# Patient Record
Sex: Female | Born: 2000 | Race: White | Hispanic: No | Marital: Single | Smoking: Never smoker
Health system: Southern US, Community
[De-identification: ages and names within clinical notes are randomized; demographics above are authoritative.]

## PROBLEM LIST (undated history)

## (undated) DIAGNOSIS — F209 Schizophrenia, unspecified: Secondary | ICD-10-CM

## (undated) DIAGNOSIS — J45909 Unspecified asthma, uncomplicated: Secondary | ICD-10-CM

## (undated) HISTORY — DX: Unspecified asthma, uncomplicated: J45.909

## (undated) HISTORY — PX: TONSILLECTOMY: SUR1361

---
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
## 2021-01-15 ENCOUNTER — Ambulatory Visit (HOSPITAL_COMMUNITY)
Admission: RE | Admit: 2021-01-15 | Discharge: 2021-01-15 | Disposition: A | Discharge status: Home / Self Care | Payer: Medicaid Other | Attending: Psychiatry | Admitting: Psychiatry
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
## 2021-02-04 IMAGING — CT CT ABD-PELV W/ CM
2 of 4 series · 16 of 46 positions shown, 18 images · IV contrast (Omnipaque)
Comparison: None.

CLINICAL DATA: 19-year-old female with right lower quadrant
abdominal pain.

EXAM:
CT ABDOMEN AND PELVIS WITH CONTRAST
TECHNIQUE: Multidetector CT imaging of the abdomen and pelvis was performed
using the standard protocol following bolus administration of
intravenous contrast.
CONTRAST:  100mL OMNIPAQUE IOHEXOL 300 MG/ML  SOLN

[Series 2: axial st · axial · 0.78mm/px · z∈[-577,-152]mm · 13 of 93 slices shown, 15 images]
[im 4/93  soft-tissue]
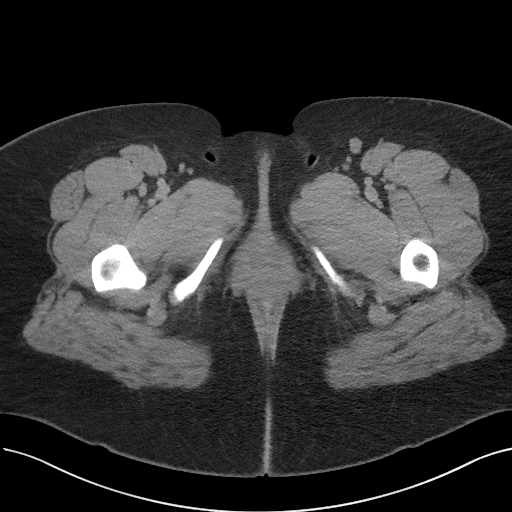
[im 4/93  bone]
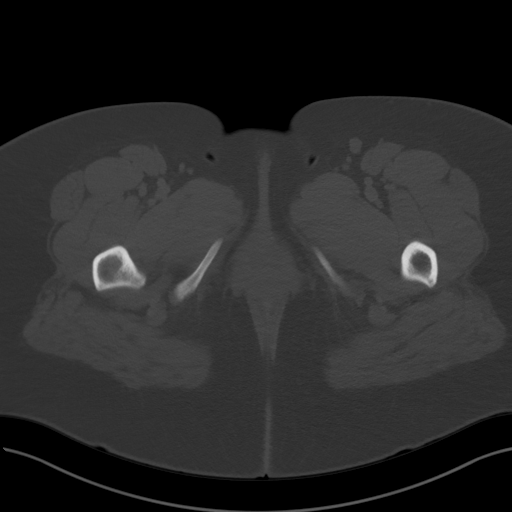
[im 12/93  soft-tissue]
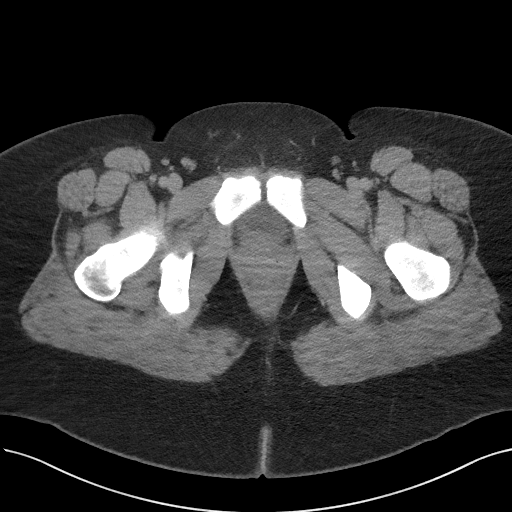
[im 20/93  soft-tissue]
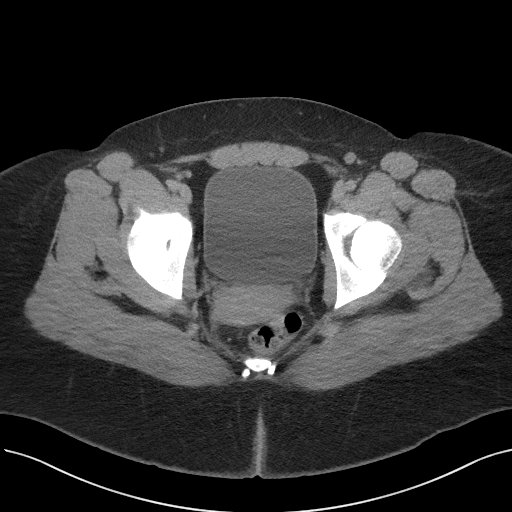
[im 27/93  soft-tissue]
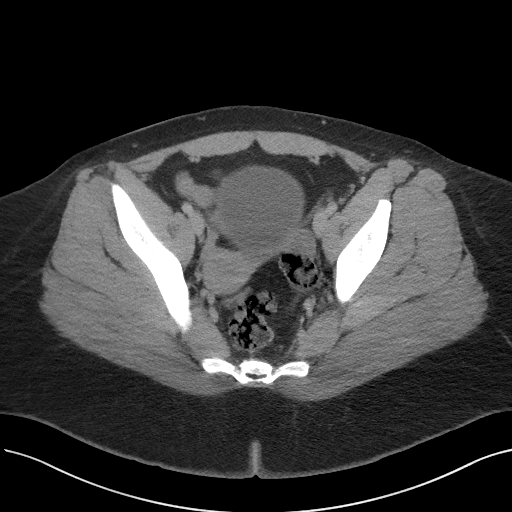
[im 31/93  soft-tissue]
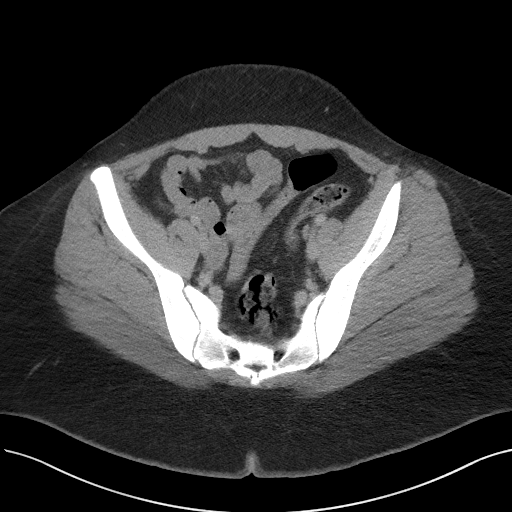
[im 39/93  soft-tissue]
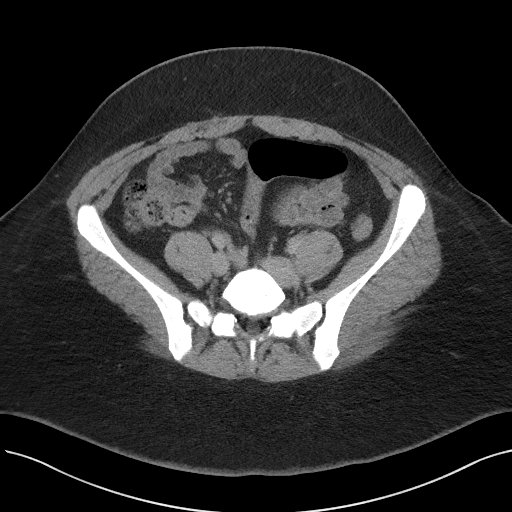
[im 47/93  soft-tissue]
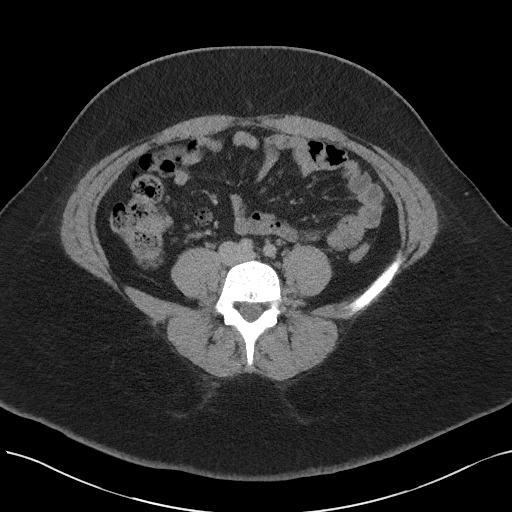
[im 54/93  soft-tissue]
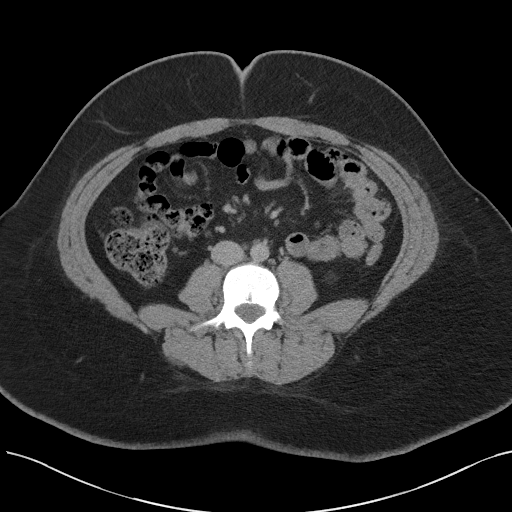
[im 62/93  soft-tissue]
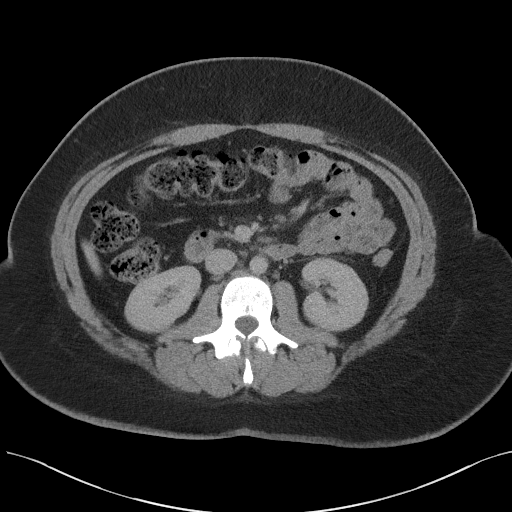
[im 62/93  bone]
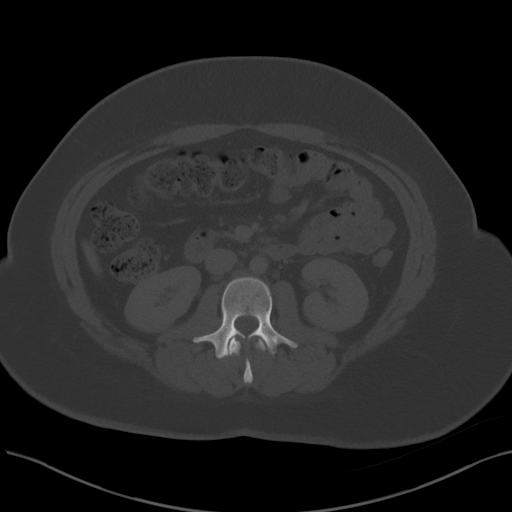
[im 66/93  soft-tissue]
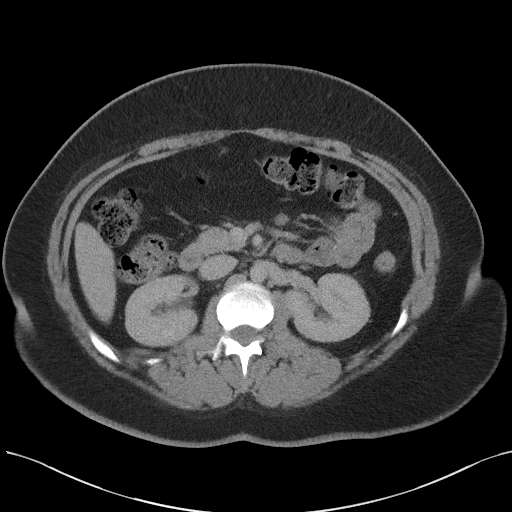
[im 73/93  soft-tissue]
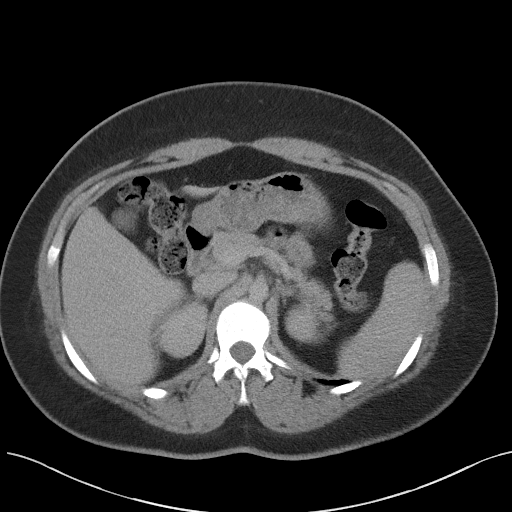
[im 81/93  soft-tissue]
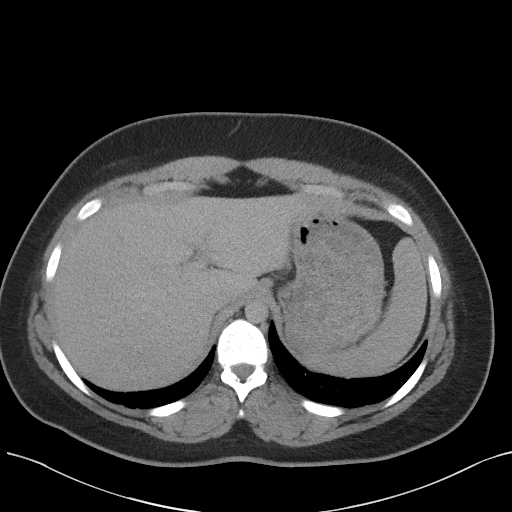
[im 89/93  soft-tissue]
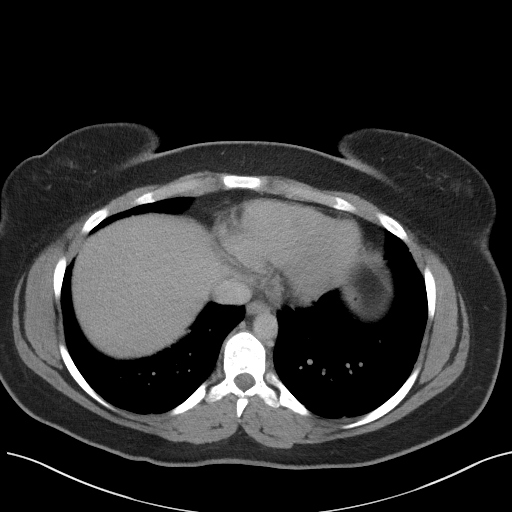

[Series 5: coronal st · coronal · 0.74mm/px · 3 of 106 slices shown]
[im 36/106  soft-tissue]
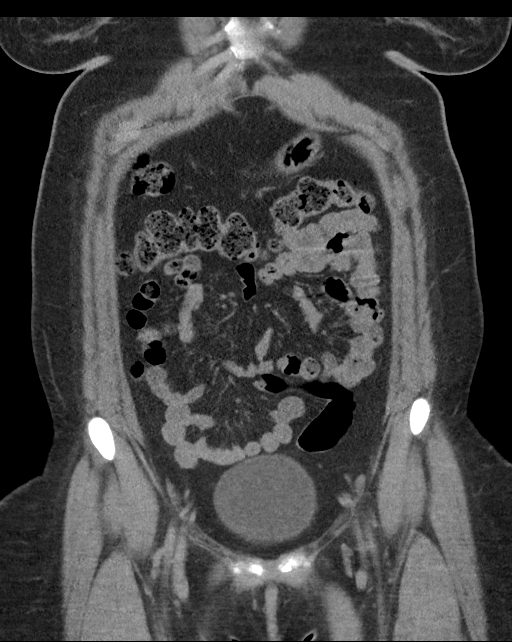
[im 47/106  soft-tissue]
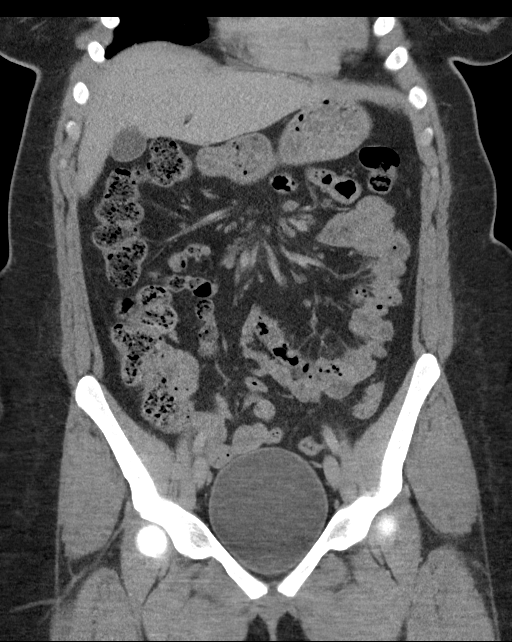
[im 59/106  soft-tissue]
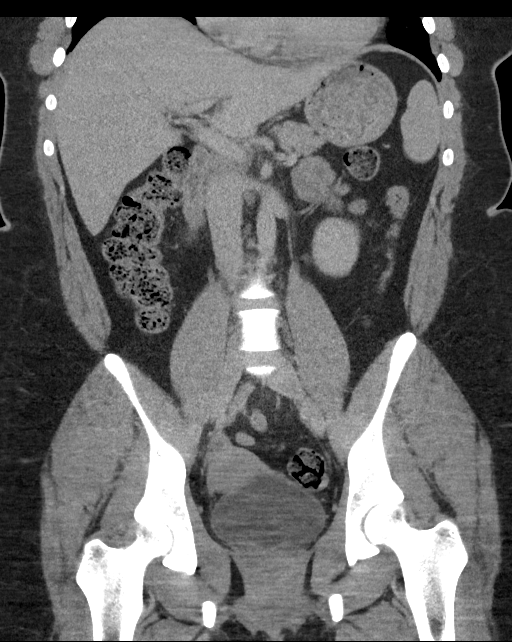

[16 of 46 positions shown; findings below may reference images not displayed]

FINDINGS: Lower chest: The visualized lung bases are clear.

No intra-abdominal free air or free fluid.

Hepatobiliary: Probable mild fatty liver. No intrahepatic biliary
dilatation. The gallbladder is unremarkable.

Pancreas: Unremarkable. No pancreatic ductal dilatation or
surrounding inflammatory changes.

Spleen: Normal in size without focal abnormality.

Adrenals/Urinary Tract: The adrenal glands unremarkable. The
kidneys, visualized ureters, and urinary bladder appear
unremarkable.

Stomach/Bowel: There is moderate stool throughout the colon. There
is no bowel obstruction or active inflammation. The appendix is
normal.

Vascular/Lymphatic: The abdominal aorta and IVC unremarkable. No
portal venous gas. There is no adenopathy.

Reproductive: The uterus is anteverted and grossly unremarkable. No
adnexal masses.

Other: None

Musculoskeletal: No acute or significant osseous findings.
IMPRESSION: No acute intra-abdominal or pelvic pathology. No bowel obstruction.
Normal appendix.

## 2021-04-27 ENCOUNTER — Ambulatory Visit (HOSPITAL_COMMUNITY)
Admission: EM | Admit: 2021-04-27 | Discharge: 2021-04-27 | Disposition: A | Discharge status: Home / Self Care | Payer: Medicaid Other | Attending: Physician Assistant | Admitting: Physician Assistant

## 2021-04-27 DIAGNOSIS — F332 Major depressive disorder, recurrent severe without psychotic features: Secondary | ICD-10-CM | POA: Insufficient documentation

## 2021-04-27 DIAGNOSIS — Z9152 Personal history of nonsuicidal self-harm: Secondary | ICD-10-CM | POA: Insufficient documentation

## 2021-04-27 DIAGNOSIS — F419 Anxiety disorder, unspecified: Secondary | ICD-10-CM | POA: Insufficient documentation

## 2021-04-27 DIAGNOSIS — F840 Autistic disorder: Secondary | ICD-10-CM | POA: Insufficient documentation
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
## 2021-04-27 NOTE — ED Notes (Signed)
Consent to call mother for transportation obtained verbally from pt. Mother contacted at [phone] who stated she was on the way to pick up Siniya. Discharge papers reviewed with Breann who verbalized understanding and all questions entertained.

## 2021-04-27 NOTE — ED Notes (Signed)
Mother here to pick up [facility]

## 2021-04-27 NOTE — Discharge Instructions (Addendum)
Please contact listed providers to schedule appointment for outpatient therapy and medication management.
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
## 2021-05-02 ENCOUNTER — Telehealth (HOSPITAL_COMMUNITY): Payer: Self-pay | Admitting: Family

## 2021-05-02 NOTE — BH Assessment (Signed)
Care Management - Follow Up [facility] Discharges   Writer attempted to make contact with patient today and was unsuccessful.  Phone just rang.  Voicemail is not set up  Per chart review, pt provided with outpatient resources

## 2022-08-19 ENCOUNTER — Ambulatory Visit (HOSPITAL_COMMUNITY)
Admission: EM | Admit: 2022-08-19 | Discharge: 2022-08-19 | Disposition: A | Discharge status: Home / Self Care | Payer: Medicaid Other | Attending: Nurse Practitioner | Admitting: Nurse Practitioner

## 2022-08-19 DIAGNOSIS — F840 Autistic disorder: Secondary | ICD-10-CM

## 2022-08-19 DIAGNOSIS — F411 Generalized anxiety disorder: Secondary | ICD-10-CM

## 2022-08-19 DIAGNOSIS — Z1152 Encounter for screening for COVID-19: Secondary | ICD-10-CM | POA: Insufficient documentation

## 2022-08-19 DIAGNOSIS — F250 Schizoaffective disorder, bipolar type: Secondary | ICD-10-CM | POA: Insufficient documentation

## 2022-08-19 DIAGNOSIS — F331 Major depressive disorder, recurrent, moderate: Secondary | ICD-10-CM | POA: Diagnosis not present

## 2022-08-19 LAB — CBC WITH DIFFERENTIAL/PLATELET
Abs Immature Granulocytes: 0.06 10*3/uL (ref 0.00–0.07)
Basophils Absolute: 0.1 10*3/uL (ref 0.0–0.1)
Basophils Relative: 0 %
Eosinophils Absolute: 0.1 10*3/uL (ref 0.0–0.5)
Eosinophils Relative: 1 %
HCT: 42.7 % (ref 36.0–46.0)
Hemoglobin: 14.7 g/dL (ref 12.0–15.0)
Immature Granulocytes: 1 %
Lymphocytes Relative: 15 %
Lymphs Abs: 1.9 10*3/uL (ref 0.7–4.0)
MCH: 30.6 pg (ref 26.0–34.0)
MCHC: 34.4 g/dL (ref 30.0–36.0)
MCV: 89 fL (ref 80.0–100.0)
Monocytes Absolute: 0.7 10*3/uL (ref 0.1–1.0)
Monocytes Relative: 6 %
Neutro Abs: 9.8 10*3/uL — ABNORMAL HIGH (ref 1.7–7.7)
Neutrophils Relative %: 77 %
Platelets: 347 10*3/uL (ref 150–400)
RBC: 4.8 MIL/uL (ref 3.87–5.11)
RDW: 11.7 % (ref 11.5–15.5)
WBC: 12.6 10*3/uL — ABNORMAL HIGH (ref 4.0–10.5)
nRBC: 0 % (ref 0.0–0.2)

## 2022-08-19 LAB — POC SARS CORONAVIRUS 2 AG: SARSCOV2ONAVIRUS 2 AG: NEGATIVE

## 2022-08-19 LAB — COMPREHENSIVE METABOLIC PANEL
ALT: 9 U/L (ref 0–44)
AST: 21 U/L (ref 15–41)
Albumin: 4.1 g/dL (ref 3.5–5.0)
Alkaline Phosphatase: 73 U/L (ref 38–126)
Anion gap: 12 (ref 5–15)
BUN: 12 mg/dL (ref 6–20)
CO2: 20 mmol/L — ABNORMAL LOW (ref 22–32)
Calcium: 9.5 mg/dL (ref 8.9–10.3)
Chloride: 107 mmol/L (ref 98–111)
Creatinine, Ser: 0.98 mg/dL (ref 0.44–1.00)
GFR, Estimated: >60 mL/min (ref >60)
Glucose, Bld: 99 mg/dL (ref 70–99)
Potassium: 4.1 mmol/L (ref 3.5–5.1)
Sodium: 139 mmol/L (ref 135–145)
Total Bilirubin: <0.1 mg/dL — ABNORMAL LOW (ref 0.3–1.2)
Total Protein: 7.4 g/dL (ref 6.5–8.1)

## 2022-08-19 LAB — RESP PANEL BY RT-PCR (RSV, FLU A&B, COVID)  RVPGX2
Influenza A by PCR: NEGATIVE
Influenza B by PCR: NEGATIVE
Resp Syncytial Virus by PCR: NEGATIVE
SARS Coronavirus 2 by RT PCR: NEGATIVE

## 2022-08-19 LAB — POCT URINE DRUG SCREEN - MANUAL ENTRY (I-SCREEN)
POC Amphetamine UR: NOT DETECTED
POC Buprenorphine (BUP): NOT DETECTED
POC Cocaine UR: NOT DETECTED
POC Marijuana UR: NOT DETECTED
POC Methadone UR: NOT DETECTED
POC Methamphetamine UR: NOT DETECTED
POC Morphine: NOT DETECTED
POC Oxazepam (BZO): NOT DETECTED
POC Oxycodone UR: NOT DETECTED
POC Secobarbital (BAR): NOT DETECTED

## 2022-08-19 LAB — ETHANOL: Alcohol, Ethyl (B): <10 mg/dL (ref <10)

## 2022-08-19 LAB — GC/CHLAMYDIA PROBE AMP (~~LOC~~) NOT AT ARMC
Chlamydia: NEGATIVE
Comment: NEGATIVE
Comment: NORMAL
Neisseria Gonorrhea: NEGATIVE

## 2022-08-19 LAB — POC URINE PREG, ED: Preg Test, Ur: NEGATIVE

## 2022-08-19 LAB — POCT PREGNANCY, URINE: Preg Test, Ur: NEGATIVE

## 2022-08-19 MED ORDER — TRAZODONE HCL 50 MG PO TABS: 50.0000 mg | ORAL_TABLET | Freq: Every evening | ORAL | 0 refills | Status: DC | PRN

## 2022-08-19 MED ORDER — MAGNESIUM HYDROXIDE 400 MG/5ML PO SUSP: 30.0000 mL | Freq: Every day | ORAL | Status: DC | PRN

## 2022-08-19 MED ORDER — HYDROXYZINE HCL 25 MG PO TABS: 25.0000 mg | ORAL_TABLET | Freq: Three times a day (TID) | ORAL | Status: DC | PRN

## 2022-08-19 MED ORDER — LITHIUM CARBONATE ER 300 MG PO TBCR: 300.0000 mg | EXTENDED_RELEASE_TABLET | Freq: Two times a day (BID) | ORAL | 0 refills | Status: DC

## 2022-08-19 MED ORDER — HYDROXYZINE HCL 25 MG PO TABS: 25.0000 mg | ORAL_TABLET | Freq: Three times a day (TID) | ORAL | 0 refills | Status: DC | PRN

## 2022-08-19 MED ORDER — ACETAMINOPHEN 325 MG PO TABS: 650.0000 mg | ORAL_TABLET | Freq: Four times a day (QID) | ORAL | Status: DC | PRN

## 2022-08-19 MED ORDER — ALUM & MAG HYDROXIDE-SIMETH 200-200-20 MG/5ML PO SUSP: 30.0000 mL | ORAL | Status: DC | PRN

## 2022-08-19 MED ORDER — TRAZODONE HCL 50 MG PO TABS: 50.0000 mg | ORAL_TABLET | Freq: Every evening | ORAL | Status: DC | PRN

## 2022-08-19 MED ORDER — LITHIUM CARBONATE ER 300 MG PO TBCR
300.0000 mg | EXTENDED_RELEASE_TABLET | Freq: Two times a day (BID) | ORAL | Status: DC
  Administered 2022-08-19: 300 mg via ORAL
  Filled 2022-08-19: qty 1

## 2022-08-19 NOTE — BH Assessment (Addendum)
Clinician spoke to [place]. With [place] to make a CPS report. Clinician provided her name and contact information. Shella reports, if a SW does not call within an hour call them back.   Clinician awaiting response.    Vertell Novak, [place], [facility], [facility] Triage Specialist [phone]

## 2022-08-19 NOTE — ED Provider Notes (Cosign Needed Addendum)
FBC/OBS ASAP Discharge Summary  Date and Time: 08/19/2022 7:01 PM  Name: Amanda [name]  MRN:  [id number]   Discharge Diagnoses:  Final diagnoses:  MDD (major depressive disorder), recurrent episode, moderate (HCC)  GAD (generalized anxiety disorder)  Schizoaffective disorder, bipolar type ([place])  Autism spectrum disorder    Subjective: Amanda [name] 22 y.o., female patient  with a history of autism, intellectual disability disorder, schizoaffective disorder bipolar type was admitted to continuations assessment after presenting to [facility] via [facility] under IVC petitioned by her mother Amanda [name] [phone]).  Per IVC: "Respondent has been diagnosed with autism, schizophrenia and bipolar disorder.  Respondent was last admitted in 2021.  Respondent suffers from self harming behaviors and has been cutting herself as recently as 6 months ago.  Respondent sent message to her friend stating I am going to kill myself to make my mother happy.  Respondent is not taking care of herself and has not showered in over a week has not slept and is over eating.  Respondent has become aggressive slamming doors concerning her family.  Respondent states that she has a Hospital doctor and there is when the blood on her hands.  Without intervention responding to harm herself or others."  Patient seen face to face by this provider, consulted with Dr. Hampton Abbot; and chart reviewed on 08/19/22.  On evaluation Amanda [name] reports patient reporting that she came in because her mom told her that she needed to see someone to get back on her medications.  Reports this happened after an argument with her mother related to letting the cat get out of the house in an argument.  At this time patient denies suicidal/self-harm/homicidal ideations, psychosis, and paranoia.  Patient reports she has been off of her psychotropic medications for a while.  Reports that she has no outpatient psychiatric  services at this time related to a past trauma and fearing going to the doctor's office.  Patient is aware and voiced understanding that in order to be restarted on her medications that she would have to make visits to the psychiatrist.  Also discussed virtual appointments with patient.  Patient admits to a prior history of self harming behaviors but states it has been over a year since she has made any cuts.  Patient reports that she has not text any sexual material to friends 43 younger.  Reports her boyfriend is 21 years old and that she does not talk to her to do things like that with any of her younger friends.  Patient reports she is eating without any difficulty.  Reports she tolerated the lithium with no adverse reactions.  Patient states that she does not sleep at night because she sleeps all day. During evaluation Amanda [name] is sitting on side of bed with no noted distress.  She is alert/oriented x 4, calm, cooperative, attentive, and responses were relevant and appropriate to assessment questions.  She spoke in a clear tone at moderate volume, and normal pace, with good eye contact.   She denies suicidal/self-harm/homicidal ideation, psychosis, and paranoia.  Objectively:  there is no evidence of psychosis/mania or delusional thinking.  She conversed coherently, with goal directed thoughts, and no distractibility, or pre-occupation and she has denied suicidal/self-harm/homicidal ideation, psychosis, and paranoia.  Collateral Information: Spoke with patient's mother Amanda [name] at [phone].  Mother reports primary concern is getting patient back on her medication and getting her set up with outpatient psychiatric services.  Reports that patient has refused  to go to the doctor related to past trauma feeling that she is going to be taken away and not allowed to come back home.  Discussed virtual appointments and mother feels that they would be a good idea.  Reports patient was also on  prazosin in the past to help with sleep and bad dreams.  Informed mother that patient was restarted on lithium but not the prazosin.  Reports that patient also informed that she was sleeping all day and was not able to sleep at night.  Informed would include sleep hygiene for patient.  Not starting prazosin at this time but patient will need to follow-up with outpatient psychiatric services for lithium level and medication management.  Understanding voiced.  Reports she will be the one to pick up patient and to give her a call when patient is ready to be discharged home.  At this time Amanda [name] is educated and verbalizes understanding of mental health resources and other crisis services in the community. She is instructed to call 911 and present to the nearest emergency room should she experience any suicidal/homicidal ideation, auditory/visual/hallucinations, or detrimental worsening of her mental health condition.    Stay Summary: Amanda [name] was admitted for Schizoaffective disorder, bipolar type [facility]), crisis management, safety, and stabilization.  Medical problems were identified and treated as needed.  Home medications were restarted, adjusted, or new medications added as needed or appropriate.  Medications treated with during admission are as follows.   Labs ordered for review:  Routine labs: CBC/Diff, CMP, HgB A1c, Lipid Profile, Magnesium, Prolactin, TSH Lab Orders         Resp panel by RT-PCR (RSV, Flu A&B, Covid) Anterior Nasal Swab         CBC with Differential/Platelet         Comprehensive metabolic panel         Ethanol         POC urine preg, ED         POCT Urine Drug Screen - (I-Screen)         POC SARS Coronavirus 2 Ag         Pregnancy, urine POC     Improvement was monitored by staff observation and clinical report along with Amanda [name] 's verbal report of emotional status and symptom reduction.  Upon completion of this admission  Amanda [name] was both mentally and medically stable for discharge denying suicidal/homicidal ideation, auditory/visual/tactile hallucinations, delusional thoughts, and paranoia.    Amanda [name] was evaluated by the treatment team for stability and plans for continued recovery upon discharge. Amanda [name] 's motivation was an integral factor for scheduling further treatment. Employment, transportation, bed availability, health status, family support, and any pending legal issues were also considered during stay. She was offered further treatment options upon discharge including but not limited to Residential, Intensive Outpatient, and Outpatient treatment.   Laberta Meredith Pyles will follow up with    Discharge Instructions      Base on the information you have provided and the presenting issue, outpatient services and resources for have been recommended.  It is imperative that you follow through with treatment recommendations within 5-7 days from the of discharge to mitigate further risk to your safety and mental well-being. A list of referrals has been provided below to get you started.  You are not limited to the list provided.  In case of an urgent crisis, you may contact the Mobile Crisis Unit with  [facility] at [phone].   Writer arranged for the patient to have an appointment with [facility] with the NP Fredric Dine on [phone] at 10am virtually for medication management.  The patient will need to come to the [facility] office today at 1:30pm to complete the paperwork.   [facility]  [address], [place], [place]      Outpatient Services for Outpatient Therapy  Based on what you have shared, a list of resources for outpatient therapy and psychiatry is provided below to get you started back on treatment.  It is imperative that you follow through with treatment within 5-7 days  from the day of discharge to prevent any further risk to your safety or mental well-being.  You are not limited to the list provided.  In case of an urgent crisis, you may contact the Mobile Crisis Unit with [facility] at [phone].         [facility] [place], [place], [address] [phone] phone  New Patient Assessment/Therapy Walk-ins Monday and Wednesday: 8am until slots are full. Every 1st and 2nd Friday: 1pm - 5pm  NO ASSESSMENT/THERAPY WALK-INS ON [place]  New Patient Psychiatry/Medication Management Walk-ins Monday-Friday: 8am-11am  For all walk-ins, we ask that you arrive by 7:30am because patient will be seen in the order of arrival.  Availability is limited; therefore, you may not be seen on the same day that you walk-in.  Our goal is to serve and meet the needs of our community to the best of our ability.   [facility] [address], [place], [place], [address] [phone] phone  [facility], [place], [place], [address] [phone] phone [phone] phone ([address], AmeriHealth, Anthem/Elevance, BCBS, [place], [place], ComPsych, Healthy Mylo, [place], [place], [place], [place], Southern Company, [place], Out of Network)  [employer], [address] [place], [place], [address] [phone] phone ([place], Anthem/Elevance, [employer] Options/Carelon, [place], [place], [place], [place], [place], Commercial Metals Company, [place], [place], [place], [facility])  [facility] [address] [place], [place], [address] [phone] phone (Medicaid, ask about other insurance)  [facility] [address], [place], [place], [address] [phone] phone [phone] fax ([address], [place] , [place], [place], [place], UHC,  Pilgrim's Pride, Self-Pay)  Evangeline Dakin [place]. [place], [place], [address] [phone] phone ([address], Anthem/Elevance, [place], [place], [place], [place], [place], Commercial Metals Company, [place], [place], [place], [facility])  [place] - 6-8 MONTH WAIT FOR THERAPY; SOONER FOR MEDICATION MANAGEMENT [address] [place], [place], [address] [phone] phone ([address], [place], [place], [place], [place], Friday Health Plans, [place], [place], [place], [place], [place], [place], Tricare, UHC, The TJ[employer] Companies, [place])  [facility] [address] [place], [place], [address] [phone] phone  [facility] [address], [place], [place], [address] [phone] phone  [facility] [address] [place], [place], [address] [phone] phone  [facility] [address] [place], [address] [phone] phone  [facility], [place] [address], [place], [address] [phone] phone  [facility] [place] [place], [place], [address] [phone] phone [phone] fax  [facility] [address] [place], [place], [address] [phone] phone [phone] fax  [employer] [phone] [address] [place], [place], [address] [phone] phone  Jinny Blossom [address] [place], [place], [address]  [phone] phone  [facility]  (Adults Only) [address] [place], [place], [address]  [phone] phone [phone] fax     ..      [email or web address]  Local Management Entity?Managed Care Organizations (LME/MCOs) [place] Currently has 6 LME/MCOs operating under the [facility] 1914 b/c  [facility] [address] [place], [place], [address] [phone] phone [phone] fax Crisis Line: [phone] [place] served: [place], [place], [place], [place], [place], [place] Office [address] [place], [place], [address] [phone] phone [phone] fax Crisis Line: [phone] served: [place], [place], [place], [place], [place], [place], [place], [place], [place], [place] Management Office [place]. [place], [place], [address] [phone] phone [phone] fax Crisis Line: [phone] [place] served: [place]ie [place], [place], [place], [place], [place], Alesia Richards, [facility] [address] [place], [place], [address] [phone] phone [phone] fax Crisis Line: [place] served: [place]ighbors, [place], [place], [place], [place], [place], Rosalio Macadamia, [place] Offices [phone] [address] [place], [address] [phone] phone Crisis Line: [place] served: [place], [place], [place], [place], [place], [place], [place], [place], [place], [place], Tri-[place], [place], [place], [place], [place], [place], [place], [place], , [place], [place], [place], [place], [place], [place], [place], [place], [facility] [address], [place], [place], [address] [phone] phone [phone] fax Crisis Line: [place] served: [place], [place], [place], [place], [place], [place], [place], [place]ow [place], [place], [place], [place], [place], [place], [place], [place], Bl[place], [place], [place], [place], [place], [place], [place], [place], [place], [place], Sh[place], [place], [place], [place], Cecille Rubin  [email or web address]                            Autism Services/Providers  The  following are clinicians within [facility] who are supposed to be specialized in working with individuals who have autism, according the [facility] Provider Directory- https://[email or web address].[email or web address]/cliniciansbehavioral.  Jacklynn Lewis Gagne [address] [place], [place], [address] [phone] phone  [place]. [place], [place], [address] [phone] phone  Caryl Comes [phone] [address] [place], [place], [address] [phone] phone  Scheryl Darter [address] [place], [place], [address] [phone] phone  Vickie Adora Fridge [address] [place], [place], [address] [phone] phone  [place], [place], [address] [phone] phone  [facility], [place] [address], [place], [address] [phone] phone  It is extremely important for caregivers to link with support groups to lessen the feeling of being isolated with this and for validation. Below is a support group for caregivers and family who have loved ones with ASD. The Autism Society of [place] can also provide additional resources that would be helpful. This organization does have a camp for children and adolescents with ASD to meet, socialize, and engage in activities together. Also check out where they may be able to also help with placement as well as assessments and therapy.  [facility] and [place]  [place], [place], [address] [phone] phone Includes: Early Intervention, General Treatment for ASD, Classroom Readiness, Social Skills Groups, and Group Family Training  Autism Society of [facility] for Life Enrichment (ACLE) [address] [place], [place], [address] [phone] phone  [facility] [place], [place], [address] [phone]  phone  [facility] [place] [address], [place], [place], [address] [phone] phone  Autism Society of [place] a Chapter/Support Group Chapters and Support Groups provide a place for families who face similar challenges to feel understood as they offer each other encouragement. guilfordchapter'@autismsociety'$ -DentistProfiles.fi  www.RetroCab.uy.gu[place]ford For more information about events and meetups, please see the calendar, contact the Chapter by email, or join the Facebook group. https://www.autismsociety-.org           Total Time spent with patient: 45 minutes  Past Psychiatric History: autism, intellectual disability disorder, schizoaffective disorder bipolar type  Past Medical History:  Past Medical History:  Diagnosis Date   Asthma    Schizophrenia ([place])     Family History: None reported Family Psychiatric History: None reported Social History:  Social History   Socioeconomic History   Marital status: Single    Spouse name: Not on file   Number of children: Not on file   Years of education: Not on file   Highest education level: Not on file  Occupational History   Not on file  Tobacco Use   Smoking status: Never   Smokeless tobacco: Never  Vaping Use   Vaping Use: Never used  Substance and Sexual Activity   Alcohol use: Never   Drug use: Never   Sexual activity: Never  Other Topics Concern   Not on file  Social History Narrative   Not on file   Social Determinants of Health   Financial Resource Strain: Not on file  Food Insecurity: Not on file  Transportation Needs: Not on file  Physical Activity: Not on file  Stress: Not on file  Social Connections: Not on file  Intimate Partner Violence: Not on file    Tobacco Cessation:  N/A, patient does not currently use tobacco products  Current Medications:  No current facility-administered medications for this encounter.    Current Outpatient Medications  Medication Sig Dispense Refill   hydrOXYzine (ATARAX) 25 MG tablet Take 1 tablet (25 mg total) by mouth 3 (three) times daily as needed for anxiety. 30 tablet 0   lithium carbonate (LITHOBID) 300 MG ER tablet Take 1 tablet (300 mg total) by mouth every 12 (twelve) hours. 60 tablet 0   prazosin (MINIPRESS) 5 MG capsule Take 5 mg by mouth at bedtime. (Patient not taking: Reported on 08/19/2022)     traZODone (DESYREL) 50 MG tablet Take 1 tablet (50 mg total) by mouth at bedtime as needed for sleep. 30 tablet 0    PTA Medications:  PTA Medications  Medication Sig   prazosin (MINIPRESS) 5 MG capsule Take 5 mg by mouth at bedtime. (Patient not taking: Reported on 08/19/2022)   lithium carbonate (LITHOBID) 300 MG ER tablet Take 1 tablet (300 mg total) by mouth every 12 (twelve) hours.   traZODone (DESYREL) 50 MG tablet Take 1 tablet (50 mg total) by mouth at bedtime as needed for sleep.       07/21/2020    2:04 PM  Depression screen PHQ 2/9  Decreased Interest 3  Down, Depressed, Hopeless 3  PHQ - 2 Score 6  Altered sleeping 0  Tired, decreased energy 3  Change in appetite 0  Feeling bad or failure about yourself  3  Trouble  concentrating 0  Moving slowly or fidgety/restless 3  PHQ-9 Score 15    Flowsheet Row ED from 08/19/2022 in [facility] OP Visit from 01/15/2021 in [place] No Risk Low Risk       Musculoskeletal  Strength & Muscle Tone: within normal limits Gait & Station: normal Patient leans: N/A  Psychiatric Specialty Exam  Presentation  General Appearance:  Appropriate for Environment  Eye Contact: Good  Speech: Clear and Coherent; Normal Rate  Speech Volume: Normal  Handedness: Right   Mood and Affect  Mood: Euthymic  Affect: Appropriate; Congruent   Thought Process  Thought Processes: Coherent; Goal Directed  Descriptions of  Associations:Intact  Orientation:Full (Time, Place and Person)  Thought Content:Logical  Diagnosis of Schizophrenia or Schizoaffective disorder in past: Yes    Hallucinations:Hallucinations: None  Ideas of Reference:None  Suicidal Thoughts:Suicidal Thoughts: No  Homicidal Thoughts:Homicidal Thoughts: No   Sensorium  Memory: Immediate Good; Recent Good  Judgment: Intact  Insight: Present   Executive Functions  Concentration: Good  Attention Span: Good  Recall: Good  Fund of Knowledge: Good  Language: Good   Psychomotor Activity  Psychomotor Activity: Psychomotor Activity: Normal   Assets  Assets: Communication Skills; Desire for Improvement; Financial Resources/Insurance; Housing; Leisure Time; Physical Health; Resilience; Social Support   Sleep  Sleep: Sleep: Fair Number of Hours of Sleep: -1   Nutritional Assessment (For OBS and FBC admissions only) Has the patient had a weight loss or gain of 10 pounds or more in the last 3 months?: No Has the patient had a decrease in food intake/or appetite?: No Does the patient have dental problems?: No Does the patient have eating habits or behaviors that may be indicators of an eating disorder including binging or inducing vomiting?: No Has the patient recently lost weight without trying?: 0 Has the patient been eating poorly because of a decreased appetite?: 0 Malnutrition Screening Tool Score: 0    Physical Exam  Physical Exam Vitals and nursing note reviewed. Exam conducted with a chaperone present.  Constitutional:      General: She is not in acute distress.    Appearance: Normal appearance. She is not ill-appearing.  HENT:     Head: Normocephalic.  Eyes:     Pupils: Pupils are equal, round, and reactive to light.  Cardiovascular:     Rate and Rhythm: Normal rate.  Pulmonary:     Effort: Pulmonary effort is normal.  Musculoskeletal:        General: Normal range of motion.     Cervical  back: Normal range of motion.  Skin:    General: Skin is warm and dry.  Neurological:     Mental Status: She is alert and oriented to person, place, and time.    Review of Systems  Constitutional: Negative.   HENT: Negative.    Eyes: Negative.   Respiratory: Negative.    Cardiovascular: Negative.   Gastrointestinal: Negative.   Genitourinary: Negative.   Musculoskeletal: Negative.   Skin: Negative.   Neurological: Negative.   Endo/Heme/Allergies: Negative.   Psychiatric/Behavioral:  Negative for hallucinations. Depression: Stable. Substance abuse: Stable. Suicidal ideas: Denies.Nervous/anxious: Stable. Insomnia: States she sleeps all day and up all night.    Blood pressure 130/74, pulse (!) 123, temperature 98.5 F (36.9 C), temperature source Oral, resp. rate 18, SpO2 98 %. There is no height or weight on file to calculate BMI.  Demographic Factors:  Caucasian  Loss Factors: NA  Historical Factors: Prior suicide attempts and Impulsivity  Risk Reduction Factors:   Sense of responsibility to family, Religious beliefs about death, Living with another person, especially a relative, and Positive social support  Continued Clinical Symptoms:  Previous Psychiatric Diagnoses and Treatments  Cognitive Features That Contribute To Risk:  None    Suicide Risk:  Minimal: No identifiable suicidal ideation.  Patients presenting with no risk factors but with morbid ruminations; may be classified as minimal risk based on the severity of the depressive symptoms  Plan Of Care/Follow-up recommendations:  Other:  Follow up with resources given  Disposition: No evidence of imminent risk to self or others at present.   Patient does not meet criteria for psychiatric inpatient admission. Supportive therapy provided about ongoing stressors. Discussed crisis plan, support from social network, calling 911, coming to the Emergency Department, and calling Suicide Hotline.  Rescind IVC After  thorough evaluation and review of information currently presented on assessment of Annagrace Natsuki Everett (respondent), there is insufficient findings to indicate respondent meets criteria for involuntary commitment or require an inpatient level of care.  Respondent is alert/oriented x 4, calm, cooperative, and mood congruent with affect.  Respondent is speaking in a clear tone at moderate volume, and normal pace, with good eye contact.  Respondents' thought process is coherent, relevant, and there is no indication that the respondent is currently responding to internal/external stimuli or experiencing delusional thought content.  Respondent denies suicidal/self-harm/homicidal ideation, psychosis, and paranoia.  Respondent has remained calm and cooperative throughout assessment and responded to questions appropriately.  Currently respondent is not significantly impaired, psychotic, or manic on exam.  A detailed risk assessment has been completed based on clinical exam and individual risk factors.  There is no evidence of imminent risk to self or others at present and respondent does not meet criteria for psychiatric inpatient admission.  Khristopher Kapaun, NP 08/19/2022, 7:01 PM
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
## 2022-08-19 NOTE — BH Assessment (Signed)
Clinician received a call back from [facility] from [place] to complete CPS report. Kayce CPS will share case with APS. Clinician expressed if APS is unable to contact her the information she shared with her is the same.    Vertell Novak, [place], [facility], [facility] Triage Specialist [phone]

## 2022-08-19 NOTE — ED Notes (Signed)
Pt given snack, watching movie. In no acute distress.
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
## 2022-08-19 NOTE — ED Notes (Signed)
Pt presents with anxious mood, affect congruent. Theone states '' I'm not really sure why I'm here. I think my mom sent me to just stay overnight and get some help. '' Pt forwards little and appears very child like with interaction with Probation officer. She denies any SI/HI and states she became upset in argument with mother but can't give details regarding what. Pt then later shares she was upset about not having wifi to her online boyfriend.  Pt denies any AH or VH. Offered breakfast and patient accepted. Pt is safe. Will con't to monitor.

## 2022-08-19 NOTE — Progress Notes (Addendum)
08/19/22 0247  Patient Reported Information  How Did You Hear About [place]? Legal System  What Is the Reason for Your Visit/Call Today? Pt reports, her mother said she was suicidal but she denies SI. Pt reports, she's not sure why her mother thinks she's suicidal. Pt reports, asked to be checked out and talk to someone. Pt also denies HI, AVH, self-injurious behaviors and access to weapons. Pt reports, she can contract for safety if discharged.  How Long Has This Been Causing You Problems? > than 6 months  What Do You Feel Would Help You the Most Today? Social Support;Treatment for Depression or other mood problem;Medication(s)  Have You Recently Had Any Thoughts About Hurting Yourself? Yes  Are You Planning to Commit Suicide/Harm Yourself At This time? No  Have you Recently Had Thoughts About [place]? No  Are You Planning To Harm Someone At This Time? No  Explanation: Pt denies.  Have You Used Any Alcohol or Drugs in the Past 24 Hours? Yes  What Did You Use and How Much? Pt reports, smoking cigarettes and vaping.  Do You Currently Have a Therapist/Psychiatrist? No  Name of Therapist/Psychiatrist Pt denies.  Have You Been Recently Discharged From Any Office Practice or Programs? Yes  Explanation of Discharge From Practice/Program Per mother, in the past the pt has ben in [place] care, group homes and PRTFs.  CCA Screening Triage Referral Assessment  Type of Contact Face-to-Face  Location of Assessment [facility] Assessment [facility]  Provider location [facility] Assessment Services  Collateral Involvement [name], mother, [phone]. Pt's mother discussed her concerns in pt's behaviors.  Does Patient Have a Stage manager Guardian? No  Legal Guardian Contact Information Per mother, pt is her own guardian.  Copy of Legal Guardianship Form in Chart No - copy requested  Legal Guardian Notified of Arrival   (Per mother, pt is her own guardian.)  Legal Guardian Notified of  Pending Discharge   (Per mother, pt is her own guardian.)  If Minor and Not Living with Parent(s), Who has Custody? Per mother, pt is her own guardian.  Is CPS involved or ever been involved? In the Past  Is APS involved or ever been involved? Never  Patient Determined To Be At Risk for Harm To Self or Others Based on Review of Patient Reported Information or Presenting Complaint? Yes, for Self-Harm (Per mother however the pt denies.)  Method No Plan  Availability of Means No access or NA  Intent Vague intent or NA  Notification Required No need or identified person  Additional Information for Danger to Others Potential  (Pt denies, HI.)  Additional Comments for Danger to Others Potential Pt denies, HI.  Are There Guns or Other Weapons in [place]? No  Types of Guns/Weapons Pt denies, access to weapons.  Are These Weapons Safely Secured?  (Pt denies, access to weapons.)  Who Could Verify You Are Able To Have These Secured: Pt denies, access to weapons.  Do You Have any Outstanding Charges, Pending Court Dates, Parole/Probation? Pt denies, legal involvement.  Contacted To Inform of Risk of Harm To Self or Others: Other: Comment (Clinician spoke to pt's mother to gather additional information.)  Does Patient Present under Involuntary Commitment? Yes  [place]  Patient Currently Receiving the Following Services: Not Receiving Services  Determination of Need Urgent (48 hours)  Options For Referral [facility] Urgent Care;Inpatient Hospitalization;Outpatient Therapy;Medication Management    Determination of need: Urgent.    Merian Capron  Ronny Flurry, [place], [facility], [facility] Triage Specialist [phone]

## 2022-08-19 NOTE — Discharge Instructions (Addendum)
Base on the information you have provided and the presenting issue, outpatient services and resources for have been recommended.  It is imperative that you follow through with treatment recommendations within 5-7 days from the of discharge to mitigate further risk to your safety and mental well-being. A list of referrals has been provided below to get you started.  You are not limited to the list provided.  In case of an urgent crisis, you may contact the Mobile Crisis Unit with [facility] at [phone].   Writer arranged for the patient to have an appointment with Neuropsychiatric with the NP Fredric Dine on [phone] at 10am virtually for medication management.  The patient will need to come to the Neuropsychiatric office today at 1:30pm to complete the paperwork.   [facility]  [address], [place], [place]      Outpatient Services for Outpatient Therapy  Based on what you have shared, a list of resources for outpatient therapy and psychiatry is provided below to get you started back on treatment.  It is imperative that you follow through with treatment within 5-7 days from the day of discharge to prevent any further risk to your safety or mental well-being.  You are not limited to the list provided.  In case of an urgent crisis, you may contact the Mobile Crisis Unit with [facility] at [phone].         [facility] [place], [place], [address] [phone] phone  New Patient Assessment/Therapy Walk-ins Monday and Wednesday: 8am until slots are full. Every 1st and 2nd Friday: 1pm - 5pm  NO ASSESSMENT/THERAPY WALK-INS ON [place]  New Patient Psychiatry/Medication Management Walk-ins Monday-Friday: 8am-11am  For all walk-ins, we ask that you arrive by 7:30am because patient will be seen in the order of arrival.  Availability is limited; therefore, you may not  be seen on the same day that you walk-in.  Our goal is to serve and meet the needs of our community to the best of our ability.   [facility] [address], [place], [place], [address] [phone] phone  [facility], [place], [place], [address] [phone] phone [phone] phone ([address], AmeriHealth, Anthem/Elevance, BCBS, [place], [place], ComPsych, Healthy [place], [place], [place], [place], [place], Southern Company, [place], Out of Network)  [employer], [place] [place], [place], [address] [phone] phone ([place], Anthem/Elevance, [employer] Options/Carelon, [place], [place], [place], [place], [place], [employer], [place], [place], [place], [facility])  [place] [address] [place], [place], [address] [phone] phone (Medicaid, ask about other insurance)  [facility] [address] [place], [place], [address] [phone] phone [phone] fax ([address], [place] , [place], [place], [place], UHC, Pilgrim's Pride, Self-Pay)  Evangeline Dakin [place]. [place], [place], [address] [phone] phone ([address] Anthem/Elevance, [place], [place], [place], [place], [place], [employer] Company, [place], [place], [place], [facility])  [place] - 6-8 MONTH WAIT FOR THERAPY; SOONER FOR MEDICATION MANAGEMENT [address], [place], [place], [address] [phone] phone ([address] [place], [place], [place], [place], Friday Health Plans, [place], [place], [place], [place], [place], [place], Tricare, UHC, The [employer] Companies, [place])  [facility] [address], [place], [place], [address] [phone] phone  [facility] [address] [place], [place], [address] [phone] phone  [facility] [address], [place], [place], [address] [phone] phone  [facility] [place] [address] [place], [address] [phone] phone  [facility], [place] [address], [place], [address] [phone] phone  [facility] [place] [place], [place], [address] [phone] phone [phone] fax  [facility] [address] [place], [place], [address] [phone] phone [phone] fax  [facility] [address] [place], [place], [address] [phone] phone  Jinny Blossom [address] [place], [place], [address]  [phone] phone  [facility] [place]  (Adults Only) [address] [place], [place], [address]  [phone] phone [phone] fax     ..      [email or web address]  Local Management Entity?Managed Care Organizations (LME/MCOs) [place] Currently has 6 LME/MCOs operating under the [facility] [address] b/c [facility] [address] [place], [place], [address] [phone] phone [phone] fax Crisis Line: [phone] [place] served: [place], [place], [place], [place], [place], [place] Office [address] [place], [place], [address] [phone] phone [phone] fax Crisis Line: [phone] served: [place], [place], [place], [place], [place], [place], [place], [place], [place], [place] Management Office [place]. [place], [place], [address] [phone] phone [phone] fax Crisis Line: [phone] [place] served: [place], [place], [place], [place], [place], Alesia Richards, [facility] [address] [place], [place], [address] [phone] phone [phone] fax Crisis Line: [place] served: [place], [place], [place], [place], [place], [place], Rosalio Macadamia, [place] Offices [phone] [address] [place], [address] [phone] phone Crisis Line: [place] served: [place], [place], [place], [place], [place], [place], [place], [place], [place], [place], [place], [place], [place], [place], [place], [place], [place], [place], [place], [place], [place], [place], [place], [place], [place], [place], [place], [facility] [address] [place], [place], [address] [phone] phone [phone] fax Crisis Line: [place] served: [place], [place], [place], [place], [place], [place], [place], [place], [place], [place] 6, [place], [place], [place], [place], [place], [place], [place], [place], [place], [place], [place], [place], [place], [place], [place], [place], [place], [place], [place], Cecille Rubin  [email or web address]                            Autism Services/Providers  The following are clinicians within [facility] who are supposed to be specialized in working with individuals who have autism, according the [facility] Provider Directory- [email or web address]/[email or web address].  Jacklynn Lewis Gagne [address] [place], [place], [address] [phone] phone  Force. [place], [place], [address] [phone] phone  Caryl Comes [phone] [place], [place], [address] [phone] phone  Scheryl Darter [address] [place], [place], [address] [phone] phone  Vickie Adora Fridge [address] [place], [place], [address] [phone] phone  [place], [place],  [address] [phone] phone  [facility], [place] [address], [place], [address] [phone] phone  It is extremely important for caregivers to link with support groups to lessen the feeling of being isolated with this and for validation. Below is a support group for caregivers and family who have loved ones with ASD. The Autism Society of [place]  can also provide additional resources that would be helpful. This organization does have a camp for children and adolescents with ASD to meet, socialize, and engage in activities together. Also check out where they may be able to also help with placement as well as assessments and therapy.  [facility] for [facility] and [place]  [place], [place], [address] [phone] phone Includes: Early Intervention, General Treatment for ASD, Classroom Readiness, Social Skills Groups, and Group Family Training  Autism Society of [place] for Life Enrichment (ACLE) [address] [place], [place], [address] [phone] phone  [facility] of [place], [place], [address] [phone] phone  [facility] [place], [place], [place], [address] [phone] phone  Autism Society of [place] a Chapter/Support Group Chapters and Support Groups provide a place for families who face similar challenges to feel understood as they offer each other encouragement. [email or web address]'@[email or web address] -DentistProfiles.fi  www.[email or web address].uy.guilford For more information about events and meetups, please see the calendar, contact the Chapter by email, or join the Facebook group. [email or web address]://www.[email or web address]-[email or web address].org

## 2022-09-14 ENCOUNTER — Other Ambulatory Visit (HOSPITAL_COMMUNITY): Payer: Self-pay | Admitting: Psychiatry

## 2022-09-15 ENCOUNTER — Other Ambulatory Visit (HOSPITAL_COMMUNITY): Payer: Self-pay | Admitting: Psychiatry

## 2023-10-24 ENCOUNTER — Ambulatory Visit
Admission: EM | Admit: 2023-10-24 | Discharge: 2023-10-24 | Disposition: A | Discharge status: Home / Self Care | Payer: MEDICAID | Attending: Internal Medicine | Admitting: Internal Medicine

## 2023-10-24 DIAGNOSIS — B379 Candidiasis, unspecified: Secondary | ICD-10-CM | POA: Diagnosis present

## 2023-10-24 DIAGNOSIS — N3001 Acute cystitis with hematuria: Secondary | ICD-10-CM | POA: Insufficient documentation

## 2023-10-24 DIAGNOSIS — T3695XA Adverse effect of unspecified systemic antibiotic, initial encounter: Secondary | ICD-10-CM | POA: Diagnosis present

## 2023-10-24 LAB — POCT URINALYSIS DIP (MANUAL ENTRY)
Bilirubin, UA: NEGATIVE
Glucose, UA: NEGATIVE mg/dL
Ketones, POC UA: NEGATIVE mg/dL
Leukocytes, UA: NEGATIVE
Nitrite, UA: POSITIVE — AB
Spec Grav, UA: >=1.03 — AB (ref 1.010–1.025)
Urobilinogen, UA: 0.2 U/dL
pH, UA: 6 (ref 5.0–8.0)

## 2023-10-24 MED ORDER — FLUCONAZOLE 150 MG PO TABS: 150.0000 mg | ORAL_TABLET | ORAL | 0 refills | Status: DC

## 2023-10-24 MED ORDER — SULFAMETHOXAZOLE-TRIMETHOPRIM 800-160 MG PO TABS: 1.0000 | ORAL_TABLET | Freq: Two times a day (BID) | ORAL | 0 refills | Status: AC

## 2023-10-24 NOTE — ED Triage Notes (Signed)
 Here with Mother. "I am having painful urination with a really bad smell". "This has been on/off about a week of so". Some "abd pain/back pain at times". No fever.

## 2023-10-24 NOTE — Discharge Instructions (Addendum)
 Your urine shows you likely have a urinary tract infection.  I have sent your urine for culture to confirm this.  We will call you if we need to change your antibiotic when we find out the type of bacteria growing in your bladder.  Take antibiotic as directed with a snack/food to avoid stomach upset. To avoid GI upset please take this medication with food.   Avoid drinking beverages that irritate the urinary tract like sodas, tea, coffee, or juice. Drink plenty of water to stay well hydrated and prevent severe infection.  If you develop any new or worsening symptoms or if your symptoms do not start to improve, pleases return here or follow-up with your primary care provider. If your symptoms are severe, please go to the emergency room.   Sitting 101/71 82  Standing 103/72 89

## 2023-10-24 NOTE — ED Provider Notes (Signed)
 [facility]    CSN: [id number] Arrival date & time: 10/24/23  1208      History   Chief Complaint Chief Complaint  Patient presents with   UTI Symptoms    HPI Amanda [name] is a 23 y.o. female.   Amanda [name] is a 23 y.o. female presenting for chief complaint of UTI Symptoms that started last week. Complains of urinary frequency, malodorous urine, and dysuria.  She drinks [facility] frequently and wonders if this is contributing to her symptoms.  Admits to minimal water intake.  Reports generalized headache and intermittent dizziness that is worsened by position changes.  Denies fever, chills, nausea, vomiting, abdominal pain, diarrhea, and flank pain.  Denies recent antibiotic or steroid use.  She does not take an SGLT2 inhibitor and denies history of frequent urinary tract infections.  She has not attempted use of any over-the-counter medications to help with symptoms PTA.     Past Medical History:  Diagnosis Date   Asthma    Schizophrenia [facility])     Patient Active Problem List   Diagnosis Date Noted   Autism spectrum disorder 08/19/2022   Schizoaffective disorder, bipolar type (HCC) 05/28/2020    Past Surgical History:  Procedure Laterality Date   TONSILLECTOMY      OB History   No obstetric history on file.      Home Medications    Prior to Admission medications   Medication Sig Start Date End Date Taking? Authorizing Provider  busPIRone (BUSPAR) 5 MG tablet Take 5 mg by mouth 3 (three) times daily. 08/27/23   [provider]  fluconazole (DIFLUCAN) 150 MG tablet Take 1 tablet (150 mg total) by mouth every 3 (three) days. 10/24/23  Yes Starlene Eaton, FNP  Oxcarbazepine (TRILEPTAL) 300 MG tablet Take 300 mg by mouth at bedtime. 08/27/23  Yes [provider]  prazosin (MINIPRESS) 1 MG capsule Take 1 mg by mouth at bedtime. 08/27/23  Yes [provider]  sulfamethoxazole-trimethoprim (BACTRIM DS)  800-160 MG tablet Take 1 tablet by mouth 2 (two) times daily for 3 days. 10/24/23 10/27/23 Yes StanhopeDanny Dye, FNP  hydrOXYzine  (ATARAX ) 25 MG tablet Take 1 tablet (25 mg total) by mouth 3 (three) times daily as needed for anxiety. 08/19/22   Rankin, Shuvon B, NP  lithium  carbonate (LITHOBID ) 300 MG ER tablet Take 1 tablet (300 mg total) by mouth every 12 (twelve) hours. 08/19/22   Rankin, Shuvon B, NP  prazosin (MINIPRESS) 5 MG capsule Take 5 mg by mouth at bedtime. Patient not taking: Reported on 08/19/2022    [provider]  traZODone  (DESYREL ) 50 MG tablet Take 1 tablet (50 mg total) by mouth at bedtime as needed for sleep. 08/19/22   Rankin, Eather Golder, NP    Family History Family History  Problem Relation Age of Onset   Diabetes Mother    Cancer Paternal Grandmother     Social History Social History   Tobacco Use   Smoking status: Never   Smokeless tobacco: Never  Vaping Use   Vaping status: Some Days   Substances: Nicotine, Flavoring  Substance Use Topics   Alcohol use: Never   Drug use: Never     Allergies   Biaxin [clarithromycin]   Review of Systems Review of Systems Per HPI  Physical Exam Triage Vital Signs ED Triage Vitals  Encounter Vitals Group     BP 10/24/23 1302 95/66     Systolic BP Percentile --  Diastolic BP Percentile --      Pulse Rate 10/24/23 1302 (!) 109     Resp 10/24/23 1302 18     Temp 10/24/23 1302 98.8 F (37.1 C)     Temp Source 10/24/23 1302 Oral     SpO2 10/24/23 1302 98 %     Weight 10/24/23 1257 252 lb 3.7 oz (114.4 kg)     Height 10/24/23 1257 5\' 10"  (1.778 m)     Head Circumference --      Peak Flow --      Pain Score 10/24/23 1254 5     Pain Loc --      Pain Education --      Exclude from Growth Chart --    No data found.  Updated Vital Signs BP 95/66 (BP Location: Left Arm)   Pulse (!) 107   Temp 98.8 F (37.1 C) (Oral)   Resp 18   Ht 5\' 10"  (1.778 m)   Wt 252 lb 3.7 oz (114.4 kg)   LMP 09/01/2023  (Exact Date)   SpO2 98%   BMI 36.19 kg/m   Visual Acuity Right Eye Distance:   Left Eye Distance:   Bilateral Distance:    Right Eye Near:   Left Eye Near:    Bilateral Near:     Physical Exam Vitals and nursing note reviewed.  Constitutional:      Appearance: She is not ill-appearing or toxic-appearing.  HENT:     Head: Normocephalic and atraumatic.     Right Ear: Hearing and external ear normal.     Left Ear: Hearing and external ear normal.     Nose: Nose normal.     Mouth/Throat:     Lips: Pink.  Eyes:     General: Lids are normal. Vision grossly intact. Gaze aligned appropriately.     Extraocular Movements: Extraocular movements intact.     Conjunctiva/sclera: Conjunctivae normal.  Pulmonary:     Effort: Pulmonary effort is normal.  Abdominal:     General: Abdomen is flat. Bowel sounds are normal. There is no distension.     Palpations: Abdomen is soft.     Tenderness: There is no abdominal tenderness. There is no right CVA tenderness, left CVA tenderness, guarding or rebound.     Hernia: No hernia is present.  Musculoskeletal:     Cervical back: Neck supple.  Skin:    General: Skin is warm and dry.     Capillary Refill: Capillary refill takes less than 2 seconds.     Findings: No rash.  Neurological:     General: No focal deficit present.     Mental Status: She is alert and oriented to person, place, and time. Mental status is at baseline.     Cranial Nerves: No dysarthria or facial asymmetry.  Psychiatric:        Mood and Affect: Mood normal.        Speech: Speech normal.        Behavior: Behavior normal.        Thought Content: Thought content normal.        Judgment: Judgment normal.      UC Treatments / Results  Labs (all labs ordered are listed, but only abnormal results are displayed) Labs Reviewed  POCT URINALYSIS DIP (MANUAL ENTRY) - Abnormal; Notable for the following components:      Result Value   Clarity, UA cloudy (*)    Spec Grav, UA  >=1.030 (*)  Blood, UA trace-intact (*)    Protein Ur, POC trace (*)    Nitrite, UA Positive (*)    All other components within normal limits  URINE CULTURE    EKG   Radiology No results found.  Procedures Procedures (including critical care time)  Medications Ordered in UC Medications - No data to display  Initial Impression / Assessment and Plan / UC Course  I have reviewed the triage vital signs and the nursing notes.  Pertinent labs & imaging results that were available during my care of the patient were reviewed by me and considered in my medical decision making (see chart for details).   1.  Acute cystitis with hematuria, antibiotic induced yeast infection Evaluation suggests acute uncomplicated cystitis.  Urine culture pending.  Low suspicion for acute pyelonephritis, kidney stone or infected stone.  She appears to be hydrated on exam. Blood pressure and heart rate from sitting to standing is stable, no orthostatic hypotension. Bactrim antibiotic ordered. Appears well hydrated, therefore will defer labs/imaging.  Patient to push fluids to stay well hydrated and reduce intake of known urinary irritants.  Discussed methods of preventing future UTI.   Counseled patient on potential for adverse effects with medications prescribed/recommended today, strict ER and return-to-clinic precautions discussed, patient verbalized understanding.    Final Clinical Impressions(s) / UC Diagnoses   Final diagnoses:  Acute cystitis with hematuria  Antibiotic-induced yeast infection     Discharge Instructions      Your urine shows you likely have a urinary tract infection.  I have sent your urine for culture to confirm this.  We will call you if we need to change your antibiotic when we find out the type of bacteria growing in your bladder.  Take antibiotic as directed with a snack/food to avoid stomach upset. To avoid GI upset please take this medication with food.    Avoid drinking beverages that irritate the urinary tract like sodas, tea, coffee, or juice. Drink plenty of water to stay well hydrated and prevent severe infection.  If you develop any new or worsening symptoms or if your symptoms do not start to improve, pleases return here or follow-up with your primary care provider. If your symptoms are severe, please go to the emergency room.   Sitting 101/71 82  Standing 103/72 89   ED Prescriptions     Medication Sig Dispense Auth. Provider   sulfamethoxazole-trimethoprim (BACTRIM DS) 800-160 MG tablet Take 1 tablet by mouth 2 (two) times daily for 3 days. 6 tablet Illyanna Petillo M, FNP   fluconazole (DIFLUCAN) 150 MG tablet Take 1 tablet (150 mg total) by mouth every 3 (three) days. 2 tablet Starlene Eaton, FNP      PDMP not reviewed this encounter.   Starlene Eaton, [place] 10/24/23 1537

## 2023-10-27 LAB — URINE CULTURE: Culture: >=100000 — AB

## 2024-07-04 ENCOUNTER — Ambulatory Visit (HOSPITAL_COMMUNITY)
Admission: EM | Admit: 2024-07-04 | Discharge: 2024-07-06 | Disposition: A | Discharge status: Other Acute Inpatient Hospital | Payer: MEDICAID

## 2024-07-04 DIAGNOSIS — F840 Autistic disorder: Secondary | ICD-10-CM | POA: Insufficient documentation

## 2024-07-04 DIAGNOSIS — F250 Schizoaffective disorder, bipolar type: Secondary | ICD-10-CM | POA: Insufficient documentation

## 2024-07-04 DIAGNOSIS — Z79899 Other long term (current) drug therapy: Secondary | ICD-10-CM | POA: Insufficient documentation

## 2024-07-04 DIAGNOSIS — F79 Unspecified intellectual disabilities: Secondary | ICD-10-CM | POA: Insufficient documentation
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
## 2024-07-05 ENCOUNTER — Other Ambulatory Visit: Payer: Self-pay

## 2024-07-05 DIAGNOSIS — F840 Autistic disorder: Secondary | ICD-10-CM | POA: Diagnosis not present

## 2024-07-05 DIAGNOSIS — F79 Unspecified intellectual disabilities: Secondary | ICD-10-CM | POA: Diagnosis not present

## 2024-07-05 DIAGNOSIS — F250 Schizoaffective disorder, bipolar type: Secondary | ICD-10-CM | POA: Diagnosis not present

## 2024-07-05 LAB — POCT URINE DRUG SCREEN - MANUAL ENTRY (I-SCREEN)
POC Amphetamine UR: NOT DETECTED
POC Buprenorphine (BUP): NOT DETECTED
POC Cocaine UR: NOT DETECTED
POC Marijuana UR: NOT DETECTED
POC Methadone UR: NOT DETECTED
POC Methamphetamine UR: NOT DETECTED
POC Morphine: NOT DETECTED
POC Oxazepam (BZO): NOT DETECTED
POC Oxycodone UR: NOT DETECTED
POC Secobarbital (BAR): NOT DETECTED

## 2024-07-05 LAB — COMPREHENSIVE METABOLIC PANEL WITH GFR
ALT: 19 U/L (ref 0–44)
AST: 29 U/L (ref 15–41)
Albumin: 4.5 g/dL (ref 3.5–5.0)
Alkaline Phosphatase: 72 U/L (ref 38–126)
Anion gap: 14 (ref 5–15)
BUN: 20 mg/dL (ref 6–20)
CO2: 25 mmol/L (ref 22–32)
Calcium: 9.9 mg/dL (ref 8.9–10.3)
Chloride: 101 mmol/L (ref 98–111)
Creatinine, Ser: 0.93 mg/dL (ref 0.44–1.00)
GFR, Estimated: >60 mL/min
Glucose, Bld: 71 mg/dL (ref 70–99)
Potassium: 4.9 mmol/L (ref 3.5–5.1)
Sodium: 140 mmol/L (ref 135–145)
Total Bilirubin: 0.5 mg/dL (ref 0.0–1.2)
Total Protein: 7.5 g/dL (ref 6.5–8.1)

## 2024-07-05 LAB — CBC WITH DIFFERENTIAL/PLATELET
Abs Immature Granulocytes: 0.11 K/uL — ABNORMAL HIGH (ref 0.00–0.07)
Basophils Absolute: 0.1 K/uL (ref 0.0–0.1)
Basophils Relative: 0 %
Eosinophils Absolute: 0 K/uL (ref 0.0–0.5)
Eosinophils Relative: 0 %
HCT: 44.7 % (ref 36.0–46.0)
Hemoglobin: 15.1 g/dL — ABNORMAL HIGH (ref 12.0–15.0)
Immature Granulocytes: 1 %
Lymphocytes Relative: 9 %
Lymphs Abs: 1.6 K/uL (ref 0.7–4.0)
MCH: 30.7 pg (ref 26.0–34.0)
MCHC: 33.8 g/dL (ref 30.0–36.0)
MCV: 90.9 fL (ref 80.0–100.0)
Monocytes Absolute: 1 K/uL (ref 0.1–1.0)
Monocytes Relative: 6 %
Neutro Abs: 15.8 K/uL — ABNORMAL HIGH (ref 1.7–7.7)
Neutrophils Relative %: 84 %
Platelets: 357 K/uL (ref 150–400)
RBC: 4.92 MIL/uL (ref 3.87–5.11)
RDW: 12 % (ref 11.5–15.5)
WBC: 18.6 K/uL — ABNORMAL HIGH (ref 4.0–10.5)
nRBC: 0 % (ref 0.0–0.2)

## 2024-07-05 LAB — ETHANOL: Alcohol, Ethyl (B): <15 mg/dL

## 2024-07-05 LAB — POCT PREGNANCY, URINE: Preg Test, Ur: NEGATIVE

## 2024-07-05 MED ORDER — DIPHENHYDRAMINE HCL 50 MG/ML IJ SOLN: 50.0000 mg | Freq: Three times a day (TID) | INTRAMUSCULAR | Status: DC | PRN

## 2024-07-05 MED ORDER — LORAZEPAM 2 MG/ML IJ SOLN: 2.0000 mg | Freq: Three times a day (TID) | INTRAMUSCULAR | Status: DC | PRN

## 2024-07-05 MED ORDER — TRAZODONE HCL 50 MG PO TABS
50.0000 mg | ORAL_TABLET | Freq: Every evening | ORAL | Status: DC | PRN
  Administered 2024-07-05: 50 mg via ORAL
  Filled 2024-07-05: qty 1

## 2024-07-05 MED ORDER — ACETAMINOPHEN 325 MG PO TABS
650.0000 mg | ORAL_TABLET | Freq: Four times a day (QID) | ORAL | Status: DC | PRN
  Administered 2024-07-05 (×2): 650 mg via ORAL
  Filled 2024-07-05 (×2): qty 2

## 2024-07-05 MED ORDER — HYDROXYZINE HCL 25 MG PO TABS
25.0000 mg | ORAL_TABLET | Freq: Three times a day (TID) | ORAL | Status: DC | PRN
  Administered 2024-07-05: 25 mg via ORAL
  Filled 2024-07-05: qty 1

## 2024-07-05 MED ORDER — HALOPERIDOL LACTATE 5 MG/ML IJ SOLN: 5.0000 mg | Freq: Three times a day (TID) | INTRAMUSCULAR | Status: DC | PRN

## 2024-07-05 MED ORDER — DIPHENHYDRAMINE HCL 50 MG PO CAPS: 50.0000 mg | ORAL_CAPSULE | Freq: Three times a day (TID) | ORAL | Status: DC | PRN

## 2024-07-05 MED ORDER — HALOPERIDOL LACTATE 5 MG/ML IJ SOLN: 10.0000 mg | Freq: Three times a day (TID) | INTRAMUSCULAR | Status: DC | PRN

## 2024-07-05 MED ORDER — LAMOTRIGINE 25 MG PO TABS
25.0000 mg | ORAL_TABLET | Freq: Every day | ORAL | Status: DC
  Administered 2024-07-05 – 2024-07-06 (×2): 25 mg via ORAL
  Filled 2024-07-05 (×2): qty 1

## 2024-07-05 MED ORDER — MAGNESIUM HYDROXIDE 400 MG/5ML PO SUSP: 30.0000 mL | Freq: Every day | ORAL | Status: DC | PRN

## 2024-07-05 MED ORDER — ALUM & MAG HYDROXIDE-SIMETH 200-200-20 MG/5ML PO SUSP: 30.0000 mL | ORAL | Status: DC | PRN

## 2024-07-05 MED ORDER — HALOPERIDOL 5 MG PO TABS: 5.0000 mg | ORAL_TABLET | Freq: Three times a day (TID) | ORAL | Status: DC | PRN
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
## 2024-07-05 NOTE — Progress Notes (Signed)
 Pt was accepted to [facility] TODAY 07/05/2024 Bed assignment: 325  Pt meets inpatient criteria per: [name] NP  Attending Physician will be: Donnelly MD  Report can be called to: [phone]  Pt can arrive after: DC   Care Team Notified: Cherylynn Ernst RN, Donia Snell NP  [place] Domnique Vantine LCSW  07/05/2024 3:12 PM

## 2024-07-05 NOTE — ED Notes (Signed)
 Pt is speaking with Donia NP at this time.

## 2024-07-05 NOTE — BH Assessment (Signed)
 Clinician spoke to Amanda [name] with [facility] Department of Social Services, Adult [employer] to make a report.  Clinician provided her name and contact information.   Clinician is awaiting return call.    Jackson JONETTA Broach, MS, [facility], [facility] Triage Specialist [phone]

## 2024-07-05 NOTE — ED Notes (Signed)
 Pt offered lunch but declined. She was given Juice. Calm and cooperative at this time.
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
## 2024-07-05 NOTE — ED Notes (Signed)
 RN spoke with patient A&Ox4, pleasant. Denies intent to harm self/others when asked. Denies A/VH or any physical complaints when asked. No acute distress noted. Active listening, support and encouragement provided. Routine safety checks conducted according to facility protocol. Encouraged patient to notify staff if thoughts of harm toward self or others arise. Patient verbalize understanding and agreement. Snacks and beverage offered.

## 2024-07-05 NOTE — ED Notes (Signed)
Patient sleeping with no s/s of distress.

## 2024-07-05 NOTE — ED Notes (Signed)
 Pt currently sleeping soundly,  breathing even and unlabored.  In view of nursing Station.

## 2024-07-05 NOTE — Progress Notes (Signed)
 Pt returned to flex area from RM 135 after speaking with DSS.  No distress noted.

## 2024-07-05 NOTE — ED Notes (Signed)
 Pt sleeping in bed, no acute distress noted. Respirations even and unlabored. Continue to monitor for safety.

## 2024-07-05 NOTE — ED Notes (Signed)
 Pt eating a hot pocket, fruit cup, and apple juice during RN assessment. Pt denies SI/HI/AVH. Bilateral foot pain managed with prn tylenol . Compliant with scheduled meds; prn trazodone  and atarax  given. She was anxious, tearful, cooperative, and pleasant. Pt took a shower this shift before going to sleep. No behavioral concerns at this time. Continue to monitor for safety.
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
## 2024-07-06 ENCOUNTER — Other Ambulatory Visit: Payer: Self-pay

## 2024-07-06 ENCOUNTER — Inpatient Hospital Stay: Admission: AD | Admit: 2024-07-06 | Payer: MEDICAID | Source: Intra-hospital | Admitting: Psychiatry

## 2024-07-06 ENCOUNTER — Encounter: Payer: Self-pay | Admitting: Psychiatry

## 2024-07-06 DIAGNOSIS — F332 Major depressive disorder, recurrent severe without psychotic features: Principal | ICD-10-CM | POA: Diagnosis present

## 2024-07-06 MED ORDER — DIPHENHYDRAMINE HCL 25 MG PO CAPS: 50.0000 mg | ORAL_CAPSULE | Freq: Three times a day (TID) | ORAL | Status: AC | PRN

## 2024-07-06 MED ORDER — LAMOTRIGINE 25 MG PO TABS
25.0000 mg | ORAL_TABLET | Freq: Every day | ORAL | Status: DC
  Administered 2024-07-06 – 2024-07-09 (×4): 25 mg via ORAL
  Filled 2024-07-06 (×4): qty 1

## 2024-07-06 MED ORDER — LORAZEPAM 2 MG/ML IJ SOLN: 2.0000 mg | Freq: Three times a day (TID) | INTRAMUSCULAR | Status: AC | PRN

## 2024-07-06 MED ORDER — HALOPERIDOL LACTATE 5 MG/ML IJ SOLN: 5.0000 mg | Freq: Three times a day (TID) | INTRAMUSCULAR | Status: AC | PRN

## 2024-07-06 MED ORDER — MAGNESIUM HYDROXIDE 400 MG/5ML PO SUSP: 30.0000 mL | Freq: Every day | ORAL | Status: AC | PRN

## 2024-07-06 MED ORDER — ACETAMINOPHEN 325 MG PO TABS: 650.0000 mg | ORAL_TABLET | Freq: Four times a day (QID) | ORAL | Status: DC | PRN

## 2024-07-06 MED ORDER — ACETAMINOPHEN 325 MG PO TABS
650.0000 mg | ORAL_TABLET | Freq: Four times a day (QID) | ORAL | Status: AC | PRN
  Administered 2024-07-13 – 2024-07-14 (×2): 650 mg via ORAL
  Filled 2024-07-06 (×2): qty 2

## 2024-07-06 MED ORDER — TRAZODONE HCL 50 MG PO TABS
50.0000 mg | ORAL_TABLET | Freq: Every evening | ORAL | Status: DC | PRN
  Administered 2024-07-06: 50 mg via ORAL
  Filled 2024-07-06: qty 1

## 2024-07-06 MED ORDER — DIPHENHYDRAMINE HCL 50 MG/ML IJ SOLN: 50.0000 mg | Freq: Three times a day (TID) | INTRAMUSCULAR | Status: AC | PRN

## 2024-07-06 MED ORDER — ALUM & MAG HYDROXIDE-SIMETH 200-200-20 MG/5ML PO SUSP: 30.0000 mL | ORAL | Status: AC | PRN

## 2024-07-06 MED ORDER — MAGNESIUM HYDROXIDE 400 MG/5ML PO SUSP: 30.0000 mL | Freq: Every day | ORAL | Status: DC | PRN

## 2024-07-06 MED ORDER — HALOPERIDOL 5 MG PO TABS: 5.0000 mg | ORAL_TABLET | Freq: Three times a day (TID) | ORAL | Status: AC | PRN

## 2024-07-06 MED ORDER — HALOPERIDOL LACTATE 5 MG/ML IJ SOLN: 10.0000 mg | Freq: Three times a day (TID) | INTRAMUSCULAR | Status: AC | PRN

## 2024-07-06 MED ORDER — ALUM & MAG HYDROXIDE-SIMETH 200-200-20 MG/5ML PO SUSP: 30.0000 mL | ORAL | Status: DC | PRN

## 2024-07-06 MED ORDER — HYDROXYZINE HCL 25 MG PO TABS
25.0000 mg | ORAL_TABLET | Freq: Three times a day (TID) | ORAL | Status: AC | PRN
  Administered 2024-07-06: 25 mg via ORAL
  Filled 2024-07-06 (×3): qty 1

## 2024-07-06 NOTE — ED Notes (Signed)
 Patient presents appropriate and cooperative. Patient currently denies SI,HI,and A/V/H with no plan or intent. Patient ate breakfast and denies any current pain. Patient is aware of pending transfer to [facility] once sheriff is able to provide an ETA. No s/s of current distress.

## 2024-07-06 NOTE — ED Notes (Signed)
Patient resting with no s/s of distress

## 2024-07-06 NOTE — Group Note (Signed)
 Date:  07/06/2024 Time:  5:11 PM  Group Topic/Focus:  Building Self Esteem:   The Focus of this group is helping patients become aware of the effects of self-esteem on their lives, the things they and others do that enhance or undermine their self-esteem, seeing the relationship between their level of self-esteem and the choices they make and learning ways to enhance self-esteem.    Participation Level:  Did Not Attend   Amanda [name] June 07/06/2024, 5:11 PM

## 2024-07-06 NOTE — Group Note (Signed)
 Date:  07/06/2024 Time:  8:58 PM  Group Topic/Focus:  Orientation:   The focus of this group is to educate the patient on the purpose and policies of crisis stabilization and provide a format to answer questions about their admission.  The group details unit policies and expectations of patients while admitted. Wrap-Up Group:   The focus of this group is to help patients review their daily goal of treatment and discuss progress on daily workbooks.    Participation Level:  Active  Participation Quality:  Appropriate and Attentive  Affect:  Appropriate  Cognitive:  Alert and Appropriate  Insight: Appropriate and Good  Engagement in Group:  Engaged  Modes of Intervention:  Orientation  Additional Comments:     Amanda [name] Servant 07/06/2024, 8:58 PM
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
## 2024-07-06 NOTE — Group Note (Signed)
 Recreation Therapy Group Note   Group Topic:Stress Management  Group Date: 07/06/2024 Start Time: 1530 End Time: 1615 Facilitators: Celestia Jeoffrey BRAVO, LRT, CTRS Location: Dayroom  Group Description: UNO. LRT and pts played games of UNO. LRT prompted group discussion on the physical signs and symptoms of stress, like those you feel when playing a competitive card game. LRT and pt discussed the physical and mental signs of stress, as well as coping skills to manage them.   Goal Area(s) Addressed: Patient will identify physical symptoms of stress. Patient will identify coping skills for stress. Patient will build frustration tolerance skills.  Patient will increase communication.    Affect/Mood: N/A   Participation Level: Did not attend    Clinical Observations/Individualized Feedback: Patient did not attend due to being a new admission.   Plan: Continue to engage patient in RT group sessions 2-3x/week.   Jeoffrey BRAVO Celestia, LRT, CTRS 07/06/2024 5:28 PM

## 2024-07-06 NOTE — ED Notes (Addendum)
 Miss charted on wong patient.  Not assigned to this patient.

## 2024-07-06 NOTE — ED Notes (Signed)
 Patient resting with eyes closed. Even rise and fall of chest with unlabored breathing. No s/s of current distress.

## 2024-07-06 NOTE — ED Notes (Signed)
 Patient is transferring to [facility] at this time via sheriff. IVC, EMTALA, and other transfer paperwork sent with patient and provided to transport. Patient is calm and cooperative at time of transport. All valuables/belongings sent with patient. Patient in no current distress.

## 2024-07-07 DIAGNOSIS — F332 Major depressive disorder, recurrent severe without psychotic features: Secondary | ICD-10-CM

## 2024-07-07 MED ORDER — ESCITALOPRAM OXALATE 10 MG PO TABS
5.0000 mg | ORAL_TABLET | Freq: Every day | ORAL | Status: DC
  Administered 2024-07-07 – 2024-07-09 (×3): 5 mg via ORAL
  Filled 2024-07-07 (×3): qty 1

## 2024-07-07 MED ORDER — TRAZODONE HCL 50 MG PO TABS
25.0000 mg | ORAL_TABLET | Freq: Every evening | ORAL | Status: AC | PRN
  Administered 2024-07-09 – 2024-07-23 (×8): 25 mg via ORAL
  Filled 2024-07-07 (×8): qty 1

## 2024-07-07 NOTE — Progress Notes (Signed)
" °   07/06/24 2100  Psych Admission Type (Psych Patients Only)  Admission Status Involuntary  Psychosocial Assessment  Patient Complaints None  Eye Contact Fair  Facial Expression Animated  Affect Appropriate to circumstance  Speech Logical/coherent  Interaction Assertive  Motor Activity Other (Comment) (WDL)  Appearance/Hygiene Disheveled  Behavior Characteristics Cooperative;Appropriate to situation  Mood Pleasant;Anxious  Aggressive Behavior  Effect No apparent injury  Thought Process  Coherency WDL  Content WDL  Delusions None reported or observed  Perception WDL  Hallucination None reported or observed  Judgment Limited  Confusion None  Danger to Self  Current suicidal ideation? Denies  Agreement Not to Harm Self Yes  Description of Agreement Verbal  Danger to Others  Danger to Others None reported or observed    "

## 2024-07-07 NOTE — BHH Suicide Risk Assessment (Incomplete)
 [facility] Admission Suicide Risk Assessment   Nursing information obtained from:  Patient Demographic factors:  Adolescent or young adult, Low socioeconomic status Current Mental Status:  NA Loss Factors:  Financial problems / change in socioeconomic status Historical Factors:  NA Risk Reduction Factors:  Sense of responsibility to family  Total Time spent with patient: {Time; 15 min - 8 hours:17441} Principal Problem: MDD (major depressive disorder), recurrent episode, severe (HCC) Diagnosis:  Principal Problem:   MDD (major depressive disorder), recurrent episode, severe (HCC)  Subjective Data: ***  Continued Clinical Symptoms:  Alcohol Use Disorder Identification Test Final Score (AUDIT): 0 The Alcohol Use Disorders Identification Test, Guidelines for Use in Primary Care, Second Edition.  World Science Writer [facility]). Score between 0-7:  no or low risk or alcohol related problems. Score between 8-15:  moderate risk of alcohol related problems. Score between 16-19:  high risk of alcohol related problems. Score 20 or above:  warrants further diagnostic evaluation for alcohol dependence and treatment.   CLINICAL FACTORS:   {Clinical Factors:22706}   Musculoskeletal: Strength & Muscle Tone: {desc; muscle tone:32375} Gait & Station: {PE GAIT ED WJUO:77474} Patient leans: {Patient Leans:21022755}  Psychiatric Specialty Exam:  Presentation  General Appearance:  Appropriate for Environment; Casual  Eye Contact: Fair  Speech: Clear and Coherent; Normal Rate  Speech Volume: Decreased  Handedness: Right   Mood and Affect  Mood: Euthymic (good)  Affect: Appropriate   Thought Process  Thought Processes: Coherent; Linear  Descriptions of Associations:Intact  Orientation:Full (Time, Place and Person)  Thought Content:Logical  History of Schizophrenia/Schizoaffective disorder:No  Duration of Psychotic Symptoms:No data  recorded Hallucinations:Hallucinations: None  Ideas of Reference:None  Suicidal Thoughts:Suicidal Thoughts: No  Homicidal Thoughts:Homicidal Thoughts: No   Sensorium  Memory: Immediate Fair; Recent Fair  Judgment: Fair  Insight: Fair   Art Therapist  Concentration: Fair  Attention Span: Fair  Recall: [employer] of Knowledge: Fair  Language: Fair   Psychomotor Activity  Psychomotor Activity: Psychomotor Activity: Normal   Assets  Assets: Communication Skills; Desire for Improvement; Housing; Resilience   Sleep  Sleep: Sleep: Good Number of Hours of Sleep: 8    Physical Exam: Physical Exam ROS Blood pressure 104/65, pulse 80, temperature 98.2 F (36.8 C), temperature source Oral, resp. rate 12, height 5' 10 (1.778 m), weight 114.4 kg, SpO2 100%. Body mass index is 36.19 kg/m.   COGNITIVE FEATURES THAT CONTRIBUTE TO RISK:  {Cognitive Features:304700251}    SUICIDE RISK:   {BHH SUICIDE RISK:22704}  PLAN OF CARE: ***  I certify that inpatient services furnished can reasonably be expected to improve the patient's condition.   Charletta Voight, NP 07/07/2024, 1:32 PM

## 2024-07-07 NOTE — Progress Notes (Signed)
 Pt calm and pleasant during assessment denying SI/HI/AVH. Pt observed by this Clinical research associate interacting appropriately with staff and peers on the unit. Pt didn't have any medication scheduled tonight and hasn't requested anything PRN as of now. Pt given education, support, and encouragement to be active in her treatment plan. Pt being monitored Q 15 minutes for safety per unit protocol, remains safe on the unit

## 2024-07-07 NOTE — Plan of Care (Signed)

## 2024-07-07 NOTE — Group Note (Signed)
 Date:  07/07/2024 Time:  1:42 PM  Group Topic/Focus:  Stages of Change:   The focus of this group is to explain the stages of change and help patients identify changes they want to make upon discharge.    Participation Level:  Did Not Attend  Amanda [name] [name] [name] 07/07/2024, 1:42 PM

## 2024-07-07 NOTE — Group Note (Signed)
 Date:  07/07/2024 Time:  9:15 PM  Group Topic/Focus:  Wrap-Up Group:   The focus of this group is to help patients review their daily goal of treatment and discuss progress on daily workbooks.    Participation Level:  Active  Participation Quality:  Appropriate  Affect:  Appropriate  Cognitive:  Appropriate  Insight: Appropriate  Engagement in Group:  Engaged  Modes of Intervention:  Discussion and Support  Additional Comments:    Ginny JONETTA Galeazzi 07/07/2024, 9:15 PM

## 2024-07-07 NOTE — Group Note (Signed)
 Date:  07/07/2024 Time:  5:20 PM  Group Topic/Focus:  Building Self Esteem:   The Focus of this group is helping patients become aware of the effects of self-esteem on their lives, the things they and others do that enhance or undermine their self-esteem, seeing the relationship between their level of self-esteem and the choices they make and learning ways to enhance self-esteem.    Participation Level:  Active  Participation Quality:  Appropriate  Affect:  Appropriate  Cognitive:  Appropriate  Insight: Appropriate  Engagement in Group:  Engaged  Modes of Intervention:  Activity  Jon A Lexx Monte 07/07/2024, 5:20 PM

## 2024-07-07 NOTE — BH IP Treatment Plan (Signed)
 Interdisciplinary Treatment and Diagnostic Plan Update  07/07/2024 Time of Session: 10:42AM Roshawn Lacina MRN: [id number]  Principal Diagnosis: MDD (major depressive disorder), recurrent episode, severe (HCC)  Secondary Diagnoses: Principal Problem:   MDD (major depressive disorder), recurrent episode, severe (HCC)   Current Medications:  Current Facility-Administered Medications  Medication Dose Route Frequency Provider Last Rate Last Admin   acetaminophen  (TYLENOL ) tablet 650 mg  650 mg Oral Q6H PRN Nkwenti, Doris, NP       alum & mag hydroxide-simeth (MAALOX/MYLANTA) 200-200-20 MG/5ML suspension 30 mL  30 mL Oral Q4H PRN Nkwenti, Doris, NP       haloperidol  (HALDOL ) tablet 5 mg  5 mg Oral TID PRN Tex Drilling, NP       And   diphenhydrAMINE  (BENADRYL ) capsule 50 mg  50 mg Oral TID PRN Tex Drilling, NP       haloperidol  lactate (HALDOL ) injection 5 mg  5 mg Intramuscular TID PRN Tex Drilling, NP       And   diphenhydrAMINE  (BENADRYL ) injection 50 mg  50 mg Intramuscular TID PRN Tex Drilling, NP       And   LORazepam  (ATIVAN ) injection 2 mg  2 mg Intramuscular TID PRN Tex Drilling, NP       haloperidol  lactate (HALDOL ) injection 10 mg  10 mg Intramuscular TID PRN Tex Drilling, NP       And   diphenhydrAMINE  (BENADRYL ) injection 50 mg  50 mg Intramuscular TID PRN Tex Drilling, NP       And   LORazepam  (ATIVAN ) injection 2 mg  2 mg Intramuscular TID PRN Tex Drilling, NP       escitalopram  (LEXAPRO ) tablet 5 mg  5 mg Oral Daily May, Tanya, NP       hydrOXYzine  (ATARAX ) tablet 25 mg  25 mg Oral TID PRN Tex Drilling, NP   25 mg at 07/06/24 2125   lamoTRIgine  (LAMICTAL ) tablet 25 mg  25 mg Oral Daily Nkwenti, Doris, NP   25 mg at 07/07/24 9180   magnesium  hydroxide (MILK OF MAGNESIA) suspension 30 mL  30 mL Oral Daily PRN Tex Drilling, NP       traZODone  (DESYREL ) tablet 25 mg  25 mg Oral QHS PRN May, Tanya, NP       PTA Medications: Medications Prior to  Admission  Medication Sig Dispense Refill Last Dose/Taking   busPIRone (BUSPAR) 5 MG tablet Take 5 mg by mouth 3 (three) times daily.      lithium  carbonate 300 MG capsule Take 300 mg by mouth daily.      naproxen sodium (ALEVE) 220 MG tablet Take 440 mg by mouth 2 (two) times daily as needed (For pain).       Patient Stressors:    Patient Strengths:    Treatment Modalities: Medication Management, Group therapy, Case management,  1 to 1 session with clinician, Psychoeducation, Recreational therapy.   Physician Treatment Plan for Primary Diagnosis: MDD (major depressive disorder), recurrent episode, severe (HCC) Long Term Goal(s): Improvement in symptoms so as ready for discharge   Short Term Goals: Ability to identify changes in lifestyle to reduce recurrence of condition will improve Ability to verbalize feelings will improve Ability to disclose and discuss suicidal ideas Ability to identify and develop effective coping behaviors will improve Ability to maintain clinical measurements within normal limits will improve Ability to identify triggers associated with substance abuse/mental health issues will improve  Medication Management: Evaluate patient's response, side effects, and tolerance of medication regimen.  Therapeutic Interventions: 1 to 1 sessions, Unit Group sessions and Medication administration.  Evaluation of Outcomes: Not Met  Physician Treatment Plan for Secondary Diagnosis: Principal Problem:   MDD (major depressive disorder), recurrent episode, severe (HCC)  Long Term Goal(s): Improvement in symptoms so as ready for discharge   Short Term Goals: Ability to identify changes in lifestyle to reduce recurrence of condition will improve Ability to verbalize feelings will improve Ability to disclose and discuss suicidal ideas Ability to identify and develop effective coping behaviors will improve Ability to maintain clinical measurements within normal limits will  improve Ability to identify triggers associated with substance abuse/mental health issues will improve     Medication Management: Evaluate patient's response, side effects, and tolerance of medication regimen.  Therapeutic Interventions: 1 to 1 sessions, Unit Group sessions and Medication administration.  Evaluation of Outcomes: Not Met   RN Treatment Plan for Primary Diagnosis: MDD (major depressive disorder), recurrent episode, severe (HCC) Long Term Goal(s): Knowledge of disease and therapeutic regimen to maintain health will improve  Short Term Goals: Ability to demonstrate self-control, Ability to participate in decision making will improve, Ability to verbalize feelings will improve, Ability to disclose and discuss suicidal ideas, Ability to identify and develop effective coping behaviors will improve, and Compliance with prescribed medications will improve  Medication Management: RN will administer medications as ordered by provider, will assess and evaluate patient's response and provide education to patient for prescribed medication. RN will report any adverse and/or side effects to prescribing provider.  Therapeutic Interventions: 1 on 1 counseling sessions, Psychoeducation, Medication administration, Evaluate responses to treatment, Monitor vital signs and CBGs as ordered, Perform/monitor CIWA, COWS, AIMS and Fall Risk screenings as ordered, Perform wound care treatments as ordered.  Evaluation of Outcomes: Not Met   LCSW Treatment Plan for Primary Diagnosis: MDD (major depressive disorder), recurrent episode, severe (HCC) Long Term Goal(s): Safe transition to appropriate next level of care at discharge, Engage patient in therapeutic group addressing interpersonal concerns.  Short Term Goals: Engage patient in aftercare planning with referrals and resources, Increase social support, Increase ability to appropriately verbalize feelings, Increase emotional regulation, Facilitate  acceptance of mental health diagnosis and concerns, and Increase skills for wellness and recovery  Therapeutic Interventions: Assess for all discharge needs, 1 to 1 time with Social worker, Explore available resources and support systems, Assess for adequacy in community support network, Educate family and significant other(s) on suicide prevention, Complete Psychosocial Assessment, Interpersonal group therapy.  Evaluation of Outcomes: Not Met   Progress in Treatment: Attending groups: No. Participating in groups: No. Taking medication as prescribed: Yes. Toleration medication: Yes. Family/Significant other contact made: No, will contact:  once permission has been granted.  Patient understands diagnosis: Yes. Discussing patient identified problems/goals with staff: Yes. Medical problems stabilized or resolved: Yes. Denies suicidal/homicidal ideation: Yes. Issues/concerns per patient self-inventory: No. Other: none  New problem(s) identified: No, Describe:  none  New Short Term/Long Term Goal(s): detox, elimination of symptoms of psychosis, medication management for mood stabilization; elimination of SI thoughts; development of comprehensive mental wellness/sobriety plan.   Patient Goals:  medication and get a therapist and psychiatrist  Discharge Plan or Barriers: CSW to assist in the development of appropriate discharge plans.   Reason for Continuation of Hospitalization: Anxiety Depression Medication stabilization Suicidal ideation  Estimated Length of Stay:  1-7 days  Last 3 [place] Suicide Severity Risk Score: Flowsheet Row Admission (Current) from 07/06/2024 in [facility] INPATIENT BEHAVIORAL MEDICINE ED from 07/04/2024 in [facility] from 10/24/2023 in [facility])  C-SSRS RISK CATEGORY No Risk No Risk Error: Q2 is Yes, you must answer 3, 4, and 5    Last PHQ 2/9 Scores:    07/21/2020    2:04 PM   Depression screen PHQ 2/9  Decreased Interest 3  Down, Depressed, Hopeless 3  PHQ - 2 Score 6  Altered sleeping 0  Tired, decreased energy 3  Change in appetite 0  Feeling bad or failure about yourself  3  Trouble concentrating 0  Moving slowly or fidgety/restless 3  PHQ-9 Score 15      Data saved with a previous flowsheet row definition    Scribe for Treatment Team: [name], KEN 07/07/2024 1:59 PM

## 2024-07-07 NOTE — Progress Notes (Signed)
 Patient is compliant with treatment verbalized feeling safe here. Denies SI/HI/A/VH and verbally contracts for safety. Q 15 minutes safety checks ongoing.
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
## 2024-07-07 NOTE — [facility] Suicide Risk Assessment (Signed)
 [facility] INPATIENT:  Family/Significant Other Suicide Prevention Education  Suicide Prevention Education:  Patient Refusal for Family/Significant Other Suicide Prevention Education: The patient Amanda [name] has refused to provide written consent for family/significant other to be provided Family/Significant Other Suicide Prevention Education during admission and/or prior to discharge.  Physician notified. SPE completed with pt, as pt refused to consent to family contact. SPI pamphlet provided to pt and pt was encouraged to share information with support network, ask questions, and talk about any concerns relating to SPE. Pt denies access to guns/firearms and verbalized understanding of information provided. Mobile Crisis information also provided to pt.    [name] 07/07/2024, 1:42 PM

## 2024-07-07 NOTE — Plan of Care (Signed)
   Problem: Education: Goal: Verbalization of understanding the information provided will improve Outcome: Progressing   Problem: Activity: Goal: Interest or engagement in activities will improve Outcome: Progressing Goal: Sleeping patterns will improve Outcome: Progressing

## 2024-07-07 NOTE — Group Note (Signed)
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
## 2024-07-08 ENCOUNTER — Encounter: Payer: Self-pay | Admitting: Psychiatry

## 2024-07-08 DIAGNOSIS — F332 Major depressive disorder, recurrent severe without psychotic features: Secondary | ICD-10-CM | POA: Diagnosis not present
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
## 2024-07-08 NOTE — Group Note (Signed)
 Date:  07/08/2024 Time:  10:05 AM  Group Topic/Focus:  Emotional Education:   The focus of this group is to discuss what feelings/emotions are, and how they are experienced.    Participation Level:  Active  Participation Quality:  Appropriate  Affect:  Appropriate  Cognitive:  Alert  Insight: Appropriate  Engagement in Group:  Engaged  Modes of Intervention:  Activity, Discussion, and Education  Additional Comments:    Amanda [name] 07/08/2024, 10:05 AM

## 2024-07-08 NOTE — Plan of Care (Signed)
   Problem: Education: Goal: Emotional status will improve Outcome: Progressing Goal: Mental status will improve Outcome: Progressing

## 2024-07-08 NOTE — Progress Notes (Signed)
" °   07/08/24 1630  Psych Admission Type (Psych Patients Only)  Admission Status Involuntary  Psychosocial Assessment  Patient Complaints Anxiety (patient stated that her anxiety is ok, it's getting better.)  Eye Contact Brief  Facial Expression Flat  Affect Appropriate to circumstance  Speech Soft  Interaction Childlike;Cautious  Motor Activity Slow  Appearance/Hygiene Layered clothes;Unremarkable  Behavior Characteristics Cooperative;Appropriate to situation  Mood Pleasant  Aggressive Behavior  Effect No apparent injury  Thought Process  Coherency WDL  Content WDL  Delusions None reported or observed  Perception WDL  Hallucination None reported or observed  Judgment Limited  Confusion None  Danger to Self  Current suicidal ideation? Denies  Self-Injurious Behavior No self-injurious ideation or behavior indicators observed or expressed   Agreement Not to Harm Self Yes  Description of Agreement Verbal  Danger to Others  Danger to Others None reported or observed   Patient's goal for today, per her self-inventory is being safe, getting a doctor, therapist, talk to a social, in which she will be patient, and be honest about everything in order to achieve her goal. "

## 2024-07-08 NOTE — Group Note (Signed)
 Date:  07/08/2024 Time:  8:51 PM  Group Topic/Focus:  Personal Choices and Values:   The focus of this group is to help patients assess and explore the importance of values in their lives, how their values affect their decisions, how they express their values and what opposes their expression.    Pt did not attend group.  Sahil Milner L 07/08/2024, 8:51 PM

## 2024-07-08 NOTE — Group Note (Signed)
 Recreation Therapy Group Note   Group Topic:Relaxation  Group Date: 07/08/2024 Start Time: 1530 End Time: 1605 Facilitators: Celestia Jeoffrey BRAVO, LRT, CTRS Location: Dayroom  Group Description: PMR (Progressive Muscle Relaxation). LRT educates patients on what PMR is and the benefits that come from it. Patients are asked to sit with their feet flat on the floor while sitting up and all the way back in their chair, if possible. LRT and pts follow a prompt through a speaker that requires you to tense and release different muscles in their body and focus on their breathing. During session, lights are off and soft music is being played. Pts are given a stress ball to use if needed.   Goal Area(s) Addressed:  Patients will be able to describe progressive muscle relaxation.  Patient will practice using relaxation technique. Patient will identify a new coping skill.  Patient will follow multistep directions to reduce anxiety and stress.   Affect/Mood: N/A   Participation Level: Did not attend    Clinical Observations/Individualized Feedback: Patient did not attend.  Plan: Continue to engage patient in RT group sessions 2-3x/week.   [address], LRT, CTRS 07/08/2024 5:22 PM

## 2024-07-08 NOTE — Group Note (Signed)
 BHH LCSW Group Therapy Note   Group Date: 07/08/2024 Start Time: 1300 End Time: 1400   Type of Therapy/Topic:  Group Therapy:  Emotion Regulation  Participation Level:  Did Not Attend   Mood:  Description of Group:    The purpose of this group is to assist patients in learning to regulate negative emotions and experience positive emotions. Patients will be guided to discuss ways in which they have been vulnerable to their negative emotions. These vulnerabilities will be juxtaposed with experiences of positive emotions or situations, and patients challenged to use positive emotions to combat negative ones. Special emphasis will be placed on coping with negative emotions in conflict situations, and patients will process healthy conflict resolution skills.  Therapeutic Goals: Patient will identify two positive emotions or experiences to reflect on in order to balance out negative emotions:  Patient will label two or more emotions that they find the most difficult to experience:  Patient will be able to demonstrate positive conflict resolution skills through discussion or role plays:   Summary of Patient Progress:   Patient did not attend.     Therapeutic Modalities:   Cognitive Behavioral Therapy Feelings Identification Dialectical Behavioral Therapy   Amanda CHRISTELLA Kerns, LCSW

## 2024-07-08 NOTE — Group Note (Signed)
 Recreation Therapy Group Note   Group Topic:Goal Setting  Group Date: 07/08/2024 Start Time: 1000 End Time: 1110 Facilitators: Celestia Jeoffrey FORBES ARTICE, CTRS Location: Craft Room  Group Description: Vision Boards. Patients were given many different magazines, a glue stick, markers, and a piece of cardstock paper. LRT and pts discussed the importance of having goals in life. LRT and pts discussed the difference between short-term and long-term goals, as well as what a SMART goal is. LRT encouraged pts to create a vision board, with images they picked and then cut out with safety scissors from the magazine, for themselves, that capture their short and long-term goals. LRT encouraged pts to show and explain their vision board to the group.   Goal Area(s) Addressed:  Patient will gain knowledge of short vs. long term goals.  Patient will identify goals for themselves. Patient will practice setting SMART goals. Patient will verbalize their goals to LRT and peers.   Affect/Mood: Appropriate   Participation Level: Minimal    Clinical Observations/Individualized Feedback: Amanda [name] was originally present in group. Pt was pulled by LCSW and missed majority of the group.   After group, pt stayed and spoke with LRT. Pt shared that one of her goals is to learn how to play the guitar. Pt asked if she would have headphones due to the noise on the unit and being overstimulated since I am autistic. LRT spoke with nurse and provided pt with foam ear plugs. Pt was receptive and thankful.  Plan: Continue to engage patient in RT group sessions 2-3x/week.   Jeoffrey FORBES Celestia, LRT, CTRS 07/08/2024 11:43 AM
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
## 2024-07-08 NOTE — Plan of Care (Signed)
?  Problem: Education: ?Goal: Knowledge of [facility] information/materials will improve ?Outcome: Progressing ?Goal: Emotional status will improve ?Outcome: Progressing ?Goal: Mental status will improve ?Outcome: Progressing ?Goal: Verbalization of understanding the information provided will improve ?Outcome: Progressing ?  ?Problem: Activity: ?Goal: Interest or engagement in activities will improve ?Outcome: Progressing ?Goal: Sleeping patterns will improve ?Outcome: Progressing ?  ?Problem: Coping: ?Goal: Ability to verbalize frustrations and anger appropriately will improve ?Outcome: Progressing ?Goal: Ability to demonstrate self-control will improve ?Outcome: Progressing ?  ?Problem: Health Behavior/Discharge Planning: ?Goal: Identification of resources available to assist in meeting health care needs will improve ?Outcome: Progressing ?Goal: Compliance with treatment plan for underlying cause of condition will improve ?Outcome: Progressing ?  ?Problem: Physical Regulation: ?Goal: Ability to maintain clinical measurements within normal limits will improve ?Outcome: Progressing ?  ?Problem: Safety: ?Goal: Periods of time without injury will increase ?Outcome: Progressing ?  ?Problem: Education: ?Goal: Ability to state activities that reduce stress will improve ?Outcome: Progressing ?  ?Problem: Coping: ?Goal: Ability to identify and develop effective coping behavior will improve ?Outcome: Progressing ?  ?Problem: Self-Concept: ?Goal: Ability to identify factors that promote anxiety will improve ?Outcome: Progressing ?Goal: Level of anxiety will decrease ?Outcome: Progressing ?Goal: Ability to modify response to factors that promote anxiety will improve ?Outcome: Progressing ?  ?Problem: Education: ?Goal: Utilization of techniques to improve thought processes will improve ?Outcome: Progressing ?Goal: Knowledge of the prescribed therapeutic regimen will improve ?Outcome: Progressing ?  ?Problem:  Activity: ?Goal: Interest or engagement in leisure activities will improve ?Outcome: Progressing ?Goal: Imbalance in normal sleep/wake cycle will improve ?Outcome: Progressing ?  ?Problem: Coping: ?Goal: Coping ability will improve ?Outcome: Progressing ?Goal: Will verbalize feelings ?Outcome: Progressing ?  ?Problem: Health Behavior/Discharge Planning: ?Goal: Ability to make decisions will improve ?Outcome: Progressing ?Goal: Compliance with therapeutic regimen will improve ?Outcome: Progressing ?  ?Problem: Role Relationship: ?Goal: Will demonstrate positive changes in social behaviors and relationships ?Outcome: Progressing ?  ?Problem: Safety: ?Goal: Ability to disclose and discuss suicidal ideas will improve ?Outcome: Progressing ?Goal: Ability to identify and utilize support systems that promote safety will improve ?Outcome: Progressing ?  ?Problem: Self-Concept: ?Goal: Will verbalize positive feelings about self ?Outcome: Progressing ?Goal: Level of anxiety will decrease ?Outcome: Progressing ?  ?

## 2024-07-09 DIAGNOSIS — F332 Major depressive disorder, recurrent severe without psychotic features: Secondary | ICD-10-CM | POA: Diagnosis not present

## 2024-07-09 MED ORDER — NICOTINE 21 MG/24HR TD PT24
21.0000 mg | MEDICATED_PATCH | Freq: Every day | TRANSDERMAL | Status: AC
  Administered 2024-07-09 – 2024-07-23 (×14): 21 mg via TRANSDERMAL
  Filled 2024-07-09 (×15): qty 1

## 2024-07-09 NOTE — Progress Notes (Signed)
 [facility] MD Progress Note  07/09/2024 12:51 PM Amanda [name]  MRN:  [id number]   Subjective:  Chart reviewed, case discussed in multidisciplinary meeting, patient seen during rounds.   1/23: Patient found in her room coloring.  She smiles as clinical research associate approaches.  In discussion she discloses that she is anxious about going home and what comes next.  She is easily redirectable. She tells clinical research associate that she has been journaling every day. She denies any concerns.  We discussed changes in her medication including increasing lamotrigine  and Lexapro . She is agreeable to plan.  She denies any rash or discomfort.  She denies any medical concerns.  She denies SI HI and AVH.  DSS and social worker remain involved.  1/22: Patient found in her room during rounds. She reported she was overwhelmed with the noise in the day room so she came to her room to decompress. She was given ear plugs by the recreational therapist. She denies SI, HI, and AVH. She endorses meeting with DSS and being able to talk with them about what was happening at home. She expresses ongoing anxiety about returning to her home with her mother. Patient presents as cognitively younger than age and is pleasant on contact. Denies all other concerns.    Past Psychiatric History: see h&P Family History:  Family History  Problem Relation Age of Onset   Diabetes Mother    Cancer Paternal Grandmother    Social History:  Social History   Substance and Sexual Activity  Alcohol Use Never     Social History   Substance and Sexual Activity  Drug Use Never    Social History   Socioeconomic History   Marital status: Single    Spouse name: Not on file   Number of children: Not on file   Years of education: Not on file   Highest education level: Not on file  Occupational History   Not on file  Tobacco Use   Smoking status: Never   Smokeless tobacco: Never  Vaping Use   Vaping status: Some Days   Substances: Nicotine , Flavoring   Substance and Sexual Activity   Alcohol use: Never   Drug use: Never   Sexual activity: Not Currently  Other Topics Concern   Not on file  Social History Narrative   Not on file   Social Drivers of Health   Tobacco Use: Low Risk (07/08/2024)   Patient History    Smoking Tobacco Use: Never    Smokeless Tobacco Use: Never    Passive Exposure: Not on file  Financial Resource Strain: Not on file  Food Insecurity: Food Insecurity Present (07/06/2024)   Epic    Worried About Programme Researcher, Broadcasting/film/video in the Last Year: Sometimes true    Ran Out of Food in the Last Year: Sometimes true  Transportation Needs: No Transportation Needs (07/06/2024)   Epic    Lack of Transportation (Medical): No    Lack of Transportation (Non-Medical): No  Physical Activity: Not on file  Stress: Not on file  Social Connections: Not on file  Depression (EYV7-0): Not on file  Alcohol Screen: Low Risk (07/06/2024)   Alcohol Screen    Last Alcohol Screening Score (AUDIT): 0  Housing: High Risk (07/06/2024)   Epic    Unable to Pay for Housing in the Last Year: Yes    Number of Times Moved in the Last Year: 0    Homeless in the Last Year: No  Utilities: At Risk (07/06/2024)   Epic  Threatened with loss of utilities: Yes  Health Literacy: Not on file   Past Medical History:  Past Medical History:  Diagnosis Date   Asthma    Schizophrenia (HCC)     Past Surgical History:  Procedure Laterality Date   TONSILLECTOMY      Current Medications: Current Facility-Administered Medications  Medication Dose Route Frequency Provider Last Rate Last Admin   acetaminophen  (TYLENOL ) tablet 650 mg  650 mg Oral Q6H PRN Nkwenti, Doris, NP       alum & mag hydroxide-simeth (MAALOX/MYLANTA) 200-200-20 MG/5ML suspension 30 mL  30 mL Oral Q4H PRN Nkwenti, Doris, NP       haloperidol  (HALDOL ) tablet 5 mg  5 mg Oral TID PRN Tex Drilling, NP       And   diphenhydrAMINE  (BENADRYL ) capsule 50 mg  50 mg Oral TID PRN Tex Drilling, NP       haloperidol  lactate (HALDOL ) injection 5 mg  5 mg Intramuscular TID PRN Tex Drilling, NP       And   diphenhydrAMINE  (BENADRYL ) injection 50 mg  50 mg Intramuscular TID PRN Tex Drilling, NP       And   LORazepam  (ATIVAN ) injection 2 mg  2 mg Intramuscular TID PRN Tex Drilling, NP       haloperidol  lactate (HALDOL ) injection 10 mg  10 mg Intramuscular TID PRN Tex Drilling, NP       And   diphenhydrAMINE  (BENADRYL ) injection 50 mg  50 mg Intramuscular TID PRN Tex Drilling, NP       And   LORazepam  (ATIVAN ) injection 2 mg  2 mg Intramuscular TID PRN Tex Drilling, NP       escitalopram  (LEXAPRO ) tablet 5 mg  5 mg Oral Daily Solmon Bohr, NP   5 mg at 07/09/24 1213   hydrOXYzine  (ATARAX ) tablet 25 mg  25 mg Oral TID PRN Tex Drilling, NP   25 mg at 07/06/24 2125   lamoTRIgine  (LAMICTAL ) tablet 25 mg  25 mg Oral Daily Tex Drilling, NP   25 mg at 07/09/24 1213   magnesium  hydroxide (MILK OF MAGNESIA) suspension 30 mL  30 mL Oral Daily PRN Tex Drilling, NP       nicotine  (NICODERM CQ  - dosed in mg/24 hours) patch 21 mg  21 mg Transdermal Daily Jadapalle, Sree, MD       traZODone  (DESYREL ) tablet 25 mg  25 mg Oral QHS PRN Isaac Lacson, NP        Lab Results: No results found for this or any previous visit (from the past 48 hours).  Blood Alcohol level:  Lab Results  Component Value Date   [facility] <15 07/05/2024   ETH <10 08/19/2022    Metabolic Disorder Labs: No results found for: HGBA1C, MPG No results found for: PROLACTIN No results found for: CHOL, TRIG, HDL, CHOLHDL, VLDL, LDLCALC  Physical Findings: AIMS:  , ,  ,  ,    CIWA:    COWS:      Psychiatric Specialty Exam:  Presentation  General Appearance:  Appropriate for Environment; Other (comment)  Eye Contact: Fair  Speech: Clear and Coherent; Normal Rate  Speech Volume: Decreased    Mood and Affect  Mood: Euthymic  Affect: Congruent; Appropriate   Thought Process   Thought Processes: Coherent; Linear  Orientation:Full (Time, Place and Person)  Thought Content:Logical  Hallucinations:Hallucinations: None  Ideas of Reference:None  Suicidal Thoughts:Suicidal Thoughts: No  Homicidal Thoughts:Homicidal Thoughts: No   Sensorium  Memory: Immediate Fair;  Recent Fair  Judgment: Fair  Insight: Fair   Chartered Certified Accountant: Fair  Attention Span: Fair  Recall: [employer] of Knowledge: Fair  Language: Fair   Psychomotor Activity  Psychomotor Activity: Psychomotor Activity: Normal  Musculoskeletal: Strength & Muscle Tone: within normal limits Gait & Station: normal Assets  Assets: Manufacturing Systems Engineer; Desire for Improvement; Talents/Skills; Resilience    Physical Exam: Physical Exam Vitals and nursing note reviewed.  Constitutional:      Appearance: Normal appearance.  Pulmonary:     Effort: Pulmonary effort is normal.  Neurological:     Mental Status: She is alert and oriented to person, place, and time.  Psychiatric:        Mood and Affect: Mood normal.        Behavior: Behavior normal.    Review of Systems  Respiratory:  Negative for shortness of breath.   Cardiovascular:  Negative for chest pain.  Gastrointestinal:  Negative for diarrhea, nausea and vomiting.  Psychiatric/Behavioral:  Positive for depression. Negative for hallucinations, substance abuse and suicidal ideas. The patient is nervous/anxious.   All other systems reviewed and are negative.  Blood pressure 112/68, pulse 67, temperature 98.1 F (36.7 C), temperature source Oral, resp. rate 17, height 5' 10 (1.778 m), weight 114.4 kg, SpO2 98%. Body mass index is 36.19 kg/m.  Diagnosis: Principal Problem:   MDD (major depressive disorder), recurrent episode, severe (HCC)   PLAN: Safety and Monitoring:  -- Involuntary admission to inpatient psychiatric unit for safety, stabilization and treatment  -- Daily contact with  patient to assess and evaluate symptoms and progress in treatment  -- Patient's case to be discussed in multi-disciplinary team meeting  -- Observation Level : q15 minute checks  -- Vital signs:  q12 hours  -- Precautions: suicide, elopement, and assault -- Encouraged patient to participate in unit milieu and in scheduled group therapies   2. Psychiatric Treatment:  Scheduled Medications: (Increase) Lamotrigine  50 mg daily (Increase) Escitalopram  10 mg daily Hydroxyzine  25 mg as needed for anxiety Trazodone  25 mg nightly (reports being over sedated so decreased from 50 mg)         -- The risks/benefits/side-effects/alternatives to this medication were discussed in detail with the patient and time was given for questions. The patient consents to medication trial.   3. Medical Issues Being Addressed:  No concerns expressed at this time.    4. Discharge Planning:   -- Social work and case management to assist with discharge planning and identification of hospital follow-up needs prior to discharge  -- Estimated LOS: 5-7 days  **DSS/APS involvement - visit occurred and waiting on feedback.   Adela Esteban, NP 07/09/2024, 12:51 PM

## 2024-07-09 NOTE — Plan of Care (Signed)
   Problem: Education: Goal: Emotional status will improve Outcome: Progressing Goal: Mental status will improve Outcome: Progressing Goal: Verbalization of understanding the information provided will improve Outcome: Progressing   Problem: Activity: Goal: Interest or engagement in activities will improve Outcome: Progressing

## 2024-07-09 NOTE — Progress Notes (Signed)
" °   07/08/24 2210  Psych Admission Type (Psych Patients Only)  Admission Status Involuntary  Psychosocial Assessment  Patient Complaints None  Eye Contact Brief  Facial Expression Flat  Affect Appropriate to circumstance  Speech Soft  Interaction Minimal  Motor Activity Slow  Appearance/Hygiene Unremarkable  Behavior Characteristics Appropriate to situation  Mood Pleasant  Thought Process  Coherency WDL  Content WDL  Delusions None reported or observed  Perception WDL  Hallucination None reported or observed  Judgment Limited  Confusion None  Danger to Self  Current suicidal ideation? Denies  Self-Injurious Behavior No self-injurious ideation or behavior indicators observed or expressed   Agreement Not to Harm Self Yes  Description of Agreement verbal  Danger to Others  Danger to Others None reported or observed    "

## 2024-07-09 NOTE — Group Note (Signed)
 Recreation Therapy Group Note   Group Topic:Leisure Education  Group Date: 07/09/2024 Start Time: 1035 End Time: 1130 Facilitators: Celestia Jeoffrey FORBES ARTICE, CTRS Location: Craft Room  Group Description: Leisure. Patients were given the option to choose from journaling, coloring, drawing, making origami, playing with playdoh, listening to music or singing karaoke. LRT and pts discussed the meaning of leisure, the importance of participating in leisure during their free time/when they're outside of the hospital, as well as how our leisure interests can also serve as coping skills.   Goal Area(s) Addressed:  Patient will identify a current leisure interest.  Patient will learn the definition of leisure. Patient will practice making a positive decision. Patient will have the opportunity to try a new leisure activity. Patient will communicate with peers and LRT.    Affect/Mood: N/A   Participation Level: Did not attend    Clinical Observations/Individualized Feedback: Patient did not attend.  Plan: Continue to engage patient in RT group sessions 2-3x/week.   Jeoffrey FORBES Celestia, LRT, CTRS 07/09/2024 12:59 PM

## 2024-07-09 NOTE — Plan of Care (Signed)
?  Problem: Education: ?Goal: Knowledge of [facility] information/materials will improve ?Outcome: Progressing ?Goal: Emotional status will improve ?Outcome: Progressing ?Goal: Mental status will improve ?Outcome: Progressing ?Goal: Verbalization of understanding the information provided will improve ?Outcome: Progressing ?  ?Problem: Activity: ?Goal: Interest or engagement in activities will improve ?Outcome: Progressing ?Goal: Sleeping patterns will improve ?Outcome: Progressing ?  ?Problem: Coping: ?Goal: Ability to verbalize frustrations and anger appropriately will improve ?Outcome: Progressing ?Goal: Ability to demonstrate self-control will improve ?Outcome: Progressing ?  ?Problem: Health Behavior/Discharge Planning: ?Goal: Identification of resources available to assist in meeting health care needs will improve ?Outcome: Progressing ?Goal: Compliance with treatment plan for underlying cause of condition will improve ?Outcome: Progressing ?  ?Problem: Physical Regulation: ?Goal: Ability to maintain clinical measurements within normal limits will improve ?Outcome: Progressing ?  ?Problem: Safety: ?Goal: Periods of time without injury will increase ?Outcome: Progressing ?  ?Problem: Education: ?Goal: Ability to state activities that reduce stress will improve ?Outcome: Progressing ?  ?Problem: Coping: ?Goal: Ability to identify and develop effective coping behavior will improve ?Outcome: Progressing ?  ?Problem: Self-Concept: ?Goal: Ability to identify factors that promote anxiety will improve ?Outcome: Progressing ?Goal: Level of anxiety will decrease ?Outcome: Progressing ?Goal: Ability to modify response to factors that promote anxiety will improve ?Outcome: Progressing ?  ?Problem: Education: ?Goal: Utilization of techniques to improve thought processes will improve ?Outcome: Progressing ?Goal: Knowledge of the prescribed therapeutic regimen will improve ?Outcome: Progressing ?  ?Problem:  Activity: ?Goal: Interest or engagement in leisure activities will improve ?Outcome: Progressing ?Goal: Imbalance in normal sleep/wake cycle will improve ?Outcome: Progressing ?  ?Problem: Coping: ?Goal: Coping ability will improve ?Outcome: Progressing ?Goal: Will verbalize feelings ?Outcome: Progressing ?  ?Problem: Health Behavior/Discharge Planning: ?Goal: Ability to make decisions will improve ?Outcome: Progressing ?Goal: Compliance with therapeutic regimen will improve ?Outcome: Progressing ?  ?Problem: Role Relationship: ?Goal: Will demonstrate positive changes in social behaviors and relationships ?Outcome: Progressing ?  ?Problem: Safety: ?Goal: Ability to disclose and discuss suicidal ideas will improve ?Outcome: Progressing ?Goal: Ability to identify and utilize support systems that promote safety will improve ?Outcome: Progressing ?  ?Problem: Self-Concept: ?Goal: Will verbalize positive feelings about self ?Outcome: Progressing ?Goal: Level of anxiety will decrease ?Outcome: Progressing ?  ?

## 2024-07-09 NOTE — Progress Notes (Signed)
" °   07/09/24 1300  Psych Admission Type (Psych Patients Only)  Admission Status Involuntary  Psychosocial Assessment  Patient Complaints Anxiety;Depression  Eye Contact Brief  Facial Expression Flat  Affect Appropriate to circumstance  Speech Soft  Interaction Childlike;Cautious  Motor Activity Slow  Appearance/Hygiene Layered clothes;Unremarkable  Behavior Characteristics Cooperative;Appropriate to situation  Mood Pleasant  Aggressive Behavior  Effect No apparent injury  Thought Process  Coherency WDL  Content WDL  Delusions None reported or observed  Perception WDL  Hallucination None reported or observed  Judgment Limited  Confusion None  Danger to Self  Current suicidal ideation? Denies  Self-Injurious Behavior No self-injurious ideation or behavior indicators observed or expressed   Agreement Not to Harm Self Yes  Description of Agreement Verbal  Danger to Others  Danger to Others None reported or observed   Patient slept in through lunch time stating that she just didn't feel well, reporting that my body is feeling weak. Patient also stated that her depression and anxiety is because she is worried about Monday. This clinical research associate wasn't able to get from patient what exactly is happening on Monday. Later on this afternoon, patient was observed sitting quietyly in her room, coloring. She told this clinical research associate that coloring or artwork makes me feel happy. Patient reported that she was feeling much better than this morning.  "

## 2024-07-09 NOTE — Group Note (Signed)
 Date:  07/09/2024 Time:  11:20 AM  Group Topic/Focus:  Building Self Esteem:   The Focus of this group is helping patients become aware of the effects of self-esteem on their lives, the things they and others do that enhance or undermine their self-esteem, seeing the relationship between their level of self-esteem and the choices they make and learning ways to enhance self-esteem.    Participation Level:  Did Not Attend   [place] DELENA June 07/09/2024, 11:20 AM

## 2024-07-09 NOTE — Group Note (Signed)
 Recreation Therapy Group Note   Group Topic:Health and Wellness  Group Date: 07/09/2024 Start Time: 1530 End Time: 1610 Facilitators: Celestia Jeoffrey BRAVO, LRT, CTRS Location: Courtyard  Group Description: [employer]. LRT and patients played games of basketball, drew with chalk, and played corn hole while outside in the courtyard while getting fresh air and sunlight. Music was being played in the background. LRT and peers conversed about different games they have played before, what they do in their free time and anything else that is on their minds. LRT encouraged pts to drink water after being outside, sweating and getting their heart rate up.  Goal Area(s) Addressed: Patient will build on frustration tolerance skills. Patients will partake in a competitive play game with peers. Patients will gain knowledge of new leisure interest/hobby.    Affect/Mood: Appropriate and Flat   Participation Level: Moderate   Participation Quality: Independent   Behavior: Calm and Cooperative   Speech/Thought Process: Coherent   Insight: Fair   Judgement: Fair    Modes of Intervention: Activity, Exploration, and Music   Patient Response to Interventions:  Receptive   Education Outcome:  In group clarification offered    Clinical Observations/Individualized Feedback: Debbe was active in their participation of session activities and group discussion. Pt interacted well with LRT and peers duration of session.    Plan: Continue to engage patient in RT group sessions 2-3x/week.   Jeoffrey BRAVO Celestia, LRT, CTRS 07/09/2024 5:13 PM

## 2024-07-10 MED ORDER — ESCITALOPRAM OXALATE 10 MG PO TABS
10.0000 mg | ORAL_TABLET | Freq: Every day | ORAL | Status: AC
  Administered 2024-07-10 – 2024-07-23 (×14): 10 mg via ORAL
  Filled 2024-07-10 (×15): qty 1

## 2024-07-10 MED ORDER — LAMOTRIGINE 25 MG PO TABS
50.0000 mg | ORAL_TABLET | Freq: Every day | ORAL | Status: AC
  Administered 2024-07-10 – 2024-07-23 (×14): 50 mg via ORAL
  Filled 2024-07-10 (×14): qty 2

## 2024-07-10 NOTE — Progress Notes (Signed)
" °   07/10/24 0410  Psych Admission Type (Psych Patients Only)  Admission Status Involuntary  Psychosocial Assessment  Patient Complaints Anxiety;Depression  Eye Contact Brief  Facial Expression Flat  Affect Appropriate to circumstance  Speech Soft  Interaction Cautious  Motor Activity Slow  Appearance/Hygiene Unremarkable  Behavior Characteristics Cooperative;Appropriate to situation  Mood Pleasant  Aggressive Behavior  Effect No apparent injury  Thought Process  Coherency WDL  Content WDL  Delusions None reported or observed  Perception WDL  Hallucination None reported or observed  Judgment Limited  Confusion None  Danger to Self  Current suicidal ideation? Denies  Self-Injurious Behavior No self-injurious ideation or behavior indicators observed or expressed   Agreement Not to Harm Self Yes  Description of Agreement Verbal  Danger to Others  Danger to Others None reported or observed    "

## 2024-07-10 NOTE — Group Note (Unsigned)
 Date:  07/10/2024 Time:  5:09 PM  Group Topic/Focus:  Goals Group:   The focus of this group is to help patients establish daily goals to achieve during treatment and discuss how the patient can incorporate goal setting into their daily lives to aide in recovery.     Participation Level:  {BHH PARTICIPATION OZCZO:77735}  Participation Quality:  {BHH PARTICIPATION QUALITY:22265}  Affect:  {BHH AFFECT:22266}  Cognitive:  {BHH COGNITIVE:22267}  Insight: {BHH Insight2:20797}  Engagement in Group:  {BHH ENGAGEMENT IN HMNLE:77731}  Modes of Intervention:  {BHH MODES OF INTERVENTION:22269}  Additional Comments:  ***  Amanda [name] 07/10/2024, 5:09 PM

## 2024-07-10 NOTE — Group Note (Signed)
 Date:  07/10/2024 Time:  8:59 PM  Group Topic/Focus:  Managing Feelings:   The focus of this group is to identify what feelings patients have difficulty handling and develop a plan to handle them in a healthier way upon discharge.    Pt did not attend group.  Amanda [name] 07/10/2024, 8:59 PM

## 2024-07-10 NOTE — Plan of Care (Signed)
" °  Problem: Activity: Goal: Interest or engagement in activities will improve Outcome: Progressing   Problem: Coping: Goal: Coping ability will improve Outcome: Progressing Goal: Will verbalize feelings Outcome: Progressing   "

## 2024-07-10 NOTE — Group Note (Signed)
 Date:  07/10/2024 Time:  5:40 PM  Group Topic/Focus:  Orientation:   The focus of this group is to educate the patient on the purpose and policies of crisis stabilization and provide a format to answer questions about their admission.  The group details unit policies and expectations of patients while admitted.    Participation Level:  Did Not Attend     Amanda [name] June 07/10/2024, 5:40 PM

## 2024-07-10 NOTE — Group Note (Signed)
 Date:  07/10/2024 Time:  3:39 AM    Additional Comments:  Did not attend group.  [place] LITTIE Gelineau 07/10/2024, 3:39 AM

## 2024-07-10 NOTE — Progress Notes (Addendum)
 [facility] MD Progress Note  07/10/2024 10:38 AM Amanda [name]  MRN:  [id number]   Subjective:  Chart reviewed, case discussed in multidisciplinary meeting, patient seen during rounds.   07/10/2024: Patient was seen laying in bed this morning for psychiatric reassessment during clinical rounds. Patient is alert and oriented X 4, calm, cooperative, and engages well in the interview. Patient reports she is feeling better, denies having any depression or anxiety. She reports good efficacy with taking her medications with no complaints of adverse reactions. Patient reports she is sleeping and eating well. Patient denies having any suicidal or homicidal ideations, or perceptual disturbances. DSS and SW are involved and following this patient. Will continue to monitor.  1/23: Patient found in her room coloring.  She smiles as clinical research associate approaches.  In discussion she discloses that she is anxious about going home and what comes next.  She is easily redirectable. She tells clinical research associate that she has been journaling every day. She denies any concerns.  We discussed changes in her medication including increasing lamotrigine  and Lexapro . She is agreeable to plan.  She denies any rash or discomfort.  She denies any medical concerns.  She denies SI HI and AVH.  DSS and social worker remain involved.  1/22: Patient found in her room during rounds. She reported she was overwhelmed with the noise in the day room so she came to her room to decompress. She was given ear plugs by the recreational therapist. She denies SI, HI, and AVH. She endorses meeting with DSS and being able to talk with them about what was happening at home. She expresses ongoing anxiety about returning to her home with her mother. Patient presents as cognitively younger than age and is pleasant on contact. Denies all other concerns.    Past Psychiatric History: see h&P Family History:  Family History  Problem Relation Age of Onset   Diabetes Mother     Cancer Paternal Grandmother    Social History:  Social History   Substance and Sexual Activity  Alcohol Use Never     Social History   Substance and Sexual Activity  Drug Use Never    Social History   Socioeconomic History   Marital status: Single    Spouse name: Not on file   Number of children: Not on file   Years of education: Not on file   Highest education level: Not on file  Occupational History   Not on file  Tobacco Use   Smoking status: Never   Smokeless tobacco: Never  Vaping Use   Vaping status: Some Days   Substances: Nicotine , Flavoring  Substance and Sexual Activity   Alcohol use: Never   Drug use: Never   Sexual activity: Not Currently  Other Topics Concern   Not on file  Social History Narrative   Not on file   Social Drivers of Health   Tobacco Use: Low Risk (07/08/2024)   Patient History    Smoking Tobacco Use: Never    Smokeless Tobacco Use: Never    Passive Exposure: Not on file  Financial Resource Strain: Not on file  Food Insecurity: Food Insecurity Present (07/06/2024)   Epic    Worried About Programme Researcher, Broadcasting/film/video in the Last Year: Sometimes true    Ran Out of Food in the Last Year: Sometimes true  Transportation Needs: No Transportation Needs (07/06/2024)   Epic    Lack of Transportation (Medical): No    Lack of Transportation (Non-Medical): No  Physical Activity:  Not on file  Stress: Not on file  Social Connections: Not on file  Depression (EYV7-0): Not on file  Alcohol Screen: Low Risk (07/06/2024)   Alcohol Screen    Last Alcohol Screening Score (AUDIT): 0  Housing: High Risk (07/06/2024)   Epic    Unable to Pay for Housing in the Last Year: Yes    Number of Times Moved in the Last Year: 0    Homeless in the Last Year: No  Utilities: At Risk (07/06/2024)   Epic    Threatened with loss of utilities: Yes  Health Literacy: Not on file   Past Medical History:  Past Medical History:  Diagnosis Date   Asthma    Schizophrenia  (HCC)     Past Surgical History:  Procedure Laterality Date   TONSILLECTOMY      Current Medications: Current Facility-Administered Medications  Medication Dose Route Frequency Provider Last Rate Last Admin   acetaminophen  (TYLENOL ) tablet 650 mg  650 mg Oral Q6H PRN Nkwenti, Doris, NP       alum & mag hydroxide-simeth (MAALOX/MYLANTA) 200-200-20 MG/5ML suspension 30 mL  30 mL Oral Q4H PRN Nkwenti, Doris, NP       haloperidol  (HALDOL ) tablet 5 mg  5 mg Oral TID PRN Tex Drilling, NP       And   diphenhydrAMINE  (BENADRYL ) capsule 50 mg  50 mg Oral TID PRN Tex Drilling, NP       haloperidol  lactate (HALDOL ) injection 5 mg  5 mg Intramuscular TID PRN Tex Drilling, NP       And   diphenhydrAMINE  (BENADRYL ) injection 50 mg  50 mg Intramuscular TID PRN Tex Drilling, NP       And   LORazepam  (ATIVAN ) injection 2 mg  2 mg Intramuscular TID PRN Tex Drilling, NP       haloperidol  lactate (HALDOL ) injection 10 mg  10 mg Intramuscular TID PRN Tex Drilling, NP       And   diphenhydrAMINE  (BENADRYL ) injection 50 mg  50 mg Intramuscular TID PRN Tex Drilling, NP       And   LORazepam  (ATIVAN ) injection 2 mg  2 mg Intramuscular TID PRN Tex Drilling, NP       escitalopram  (LEXAPRO ) tablet 10 mg  10 mg Oral Daily May, Tanya, NP   10 mg at 07/10/24 9053   hydrOXYzine  (ATARAX ) tablet 25 mg  25 mg Oral TID PRN Tex Drilling, NP   25 mg at 07/06/24 2125   lamoTRIgine  (LAMICTAL ) tablet 50 mg  50 mg Oral Daily May, Tanya, NP   50 mg at 07/10/24 9052   magnesium  hydroxide (MILK OF MAGNESIA) suspension 30 mL  30 mL Oral Daily PRN Tex Drilling, NP       nicotine  (NICODERM CQ  - dosed in mg/24 hours) patch 21 mg  21 mg Transdermal Daily Jadapalle, Sree, MD   21 mg at 07/10/24 0946   traZODone  (DESYREL ) tablet 25 mg  25 mg Oral QHS PRN May, Tanya, NP   25 mg at 07/09/24 2124    Lab Results: No results found for this or any previous visit (from the past 48 hours).  Blood Alcohol level:  Lab  Results  Component Value Date   [facility] <15 07/05/2024   ETH <10 08/19/2022    Metabolic Disorder Labs: No results found for: HGBA1C, MPG No results found for: PROLACTIN No results found for: CHOL, TRIG, HDL, CHOLHDL, VLDL, LDLCALC  Physical Findings: AIMS:  , ,  ,  ,  CIWA:    COWS:      Psychiatric Specialty Exam:  Presentation  General Appearance:  Appropriate for Environment; Other (comment)  Eye Contact: Fair  Speech: Clear and Coherent; Normal Rate  Speech Volume: Decreased    Mood and Affect  Mood: Euthymic  Affect: Congruent; Appropriate   Thought Process  Thought Processes: Coherent; Linear  Orientation:Full (Time, Place and Person)  Thought Content:Logical  Hallucinations:No data recorded  Ideas of Reference:None  Suicidal Thoughts:No data recorded  Homicidal Thoughts:No data recorded   Sensorium  Memory: Immediate Fair; Recent Fair  Judgment: Fair  Insight: Fair   Art Therapist  Concentration: Fair  Attention Span: Fair  Recall: [employer] of Knowledge: Fair  Language: Fair   Psychomotor Activity  Psychomotor Activity: No data recorded  Musculoskeletal: Strength & Muscle Tone: within normal limits Gait & Station: normal Assets  Assets: Manufacturing Systems Engineer; Desire for Improvement; Talents/Skills; Resilience    Physical Exam: Physical Exam Vitals and nursing note reviewed.  Constitutional:      Appearance: Normal appearance.  Pulmonary:     Effort: Pulmonary effort is normal.  Neurological:     Mental Status: She is alert and oriented to person, place, and time.  Psychiatric:        Mood and Affect: Mood normal.        Behavior: Behavior normal.    Review of Systems  Respiratory:  Negative for shortness of breath.   Cardiovascular:  Negative for chest pain.  Gastrointestinal:  Negative for diarrhea, nausea and vomiting.  Psychiatric/Behavioral:  Positive for  depression. Negative for hallucinations, substance abuse and suicidal ideas. The patient is nervous/anxious.   All other systems reviewed and are negative.  Blood pressure 96/63, pulse 81, temperature 97.7 F (36.5 C), temperature source Oral, resp. rate 16, height 5' 10 (1.778 m), weight 114.4 kg, SpO2 98%. Body mass index is 36.19 kg/m.  Diagnosis: Principal Problem:   MDD (major depressive disorder), recurrent episode, severe (HCC)   PLAN: Safety and Monitoring:  -- Involuntary admission to inpatient psychiatric unit for safety, stabilization and treatment  -- Daily contact with patient to assess and evaluate symptoms and progress in treatment  -- Patient's case to be discussed in multi-disciplinary team meeting  -- Observation Level : q15 minute checks  -- Vital signs:  q12 hours  -- Precautions: suicide, elopement, and assault -- Encouraged patient to participate in unit milieu and in scheduled group therapies   2. Psychiatric Treatment:  Scheduled Medications: (Increase) Lamotrigine  50 mg daily (Increase) Escitalopram  10 mg daily Hydroxyzine  25 mg as needed for anxiety Trazodone  25 mg nightly (reports being over sedated so decreased from 50 mg)         -- The risks/benefits/side-effects/alternatives to this medication were discussed in detail with the patient and time was given for questions. The patient consents to medication trial.   3. Medical Issues Being Addressed:  No concerns expressed at this time.    4. Discharge Planning:   -- Social work and case management to assist with discharge planning and identification of hospital follow-up needs prior to discharge  -- Estimated LOS: 5-7 days  **DSS/APS involvement - visit occurred and waiting on feedback.   Camelia JINNY Mountain, NP 07/10/2024, 10:38 AM

## 2024-07-10 NOTE — Plan of Care (Signed)
?  Problem: Education: ?Goal: Knowledge of [facility] information/materials will improve ?Outcome: Progressing ?Goal: Emotional status will improve ?Outcome: Progressing ?Goal: Mental status will improve ?Outcome: Progressing ?Goal: Verbalization of understanding the information provided will improve ?Outcome: Progressing ?  ?Problem: Activity: ?Goal: Interest or engagement in activities will improve ?Outcome: Progressing ?Goal: Sleeping patterns will improve ?Outcome: Progressing ?  ?Problem: Coping: ?Goal: Ability to verbalize frustrations and anger appropriately will improve ?Outcome: Progressing ?Goal: Ability to demonstrate self-control will improve ?Outcome: Progressing ?  ?Problem: Health Behavior/Discharge Planning: ?Goal: Identification of resources available to assist in meeting health care needs will improve ?Outcome: Progressing ?Goal: Compliance with treatment plan for underlying cause of condition will improve ?Outcome: Progressing ?  ?Problem: Physical Regulation: ?Goal: Ability to maintain clinical measurements within normal limits will improve ?Outcome: Progressing ?  ?Problem: Safety: ?Goal: Periods of time without injury will increase ?Outcome: Progressing ?  ?Problem: Education: ?Goal: Ability to state activities that reduce stress will improve ?Outcome: Progressing ?  ?Problem: Coping: ?Goal: Ability to identify and develop effective coping behavior will improve ?Outcome: Progressing ?  ?Problem: Self-Concept: ?Goal: Ability to identify factors that promote anxiety will improve ?Outcome: Progressing ?Goal: Level of anxiety will decrease ?Outcome: Progressing ?Goal: Ability to modify response to factors that promote anxiety will improve ?Outcome: Progressing ?  ?Problem: Education: ?Goal: Utilization of techniques to improve thought processes will improve ?Outcome: Progressing ?Goal: Knowledge of the prescribed therapeutic regimen will improve ?Outcome: Progressing ?  ?Problem:  Activity: ?Goal: Interest or engagement in leisure activities will improve ?Outcome: Progressing ?Goal: Imbalance in normal sleep/wake cycle will improve ?Outcome: Progressing ?  ?Problem: Coping: ?Goal: Coping ability will improve ?Outcome: Progressing ?Goal: Will verbalize feelings ?Outcome: Progressing ?  ?Problem: Health Behavior/Discharge Planning: ?Goal: Ability to make decisions will improve ?Outcome: Progressing ?Goal: Compliance with therapeutic regimen will improve ?Outcome: Progressing ?  ?Problem: Role Relationship: ?Goal: Will demonstrate positive changes in social behaviors and relationships ?Outcome: Progressing ?  ?Problem: Safety: ?Goal: Ability to disclose and discuss suicidal ideas will improve ?Outcome: Progressing ?Goal: Ability to identify and utilize support systems that promote safety will improve ?Outcome: Progressing ?  ?Problem: Self-Concept: ?Goal: Will verbalize positive feelings about self ?Outcome: Progressing ?Goal: Level of anxiety will decrease ?Outcome: Progressing ?  ?

## 2024-07-11 DIAGNOSIS — F332 Major depressive disorder, recurrent severe without psychotic features: Secondary | ICD-10-CM | POA: Diagnosis not present

## 2024-07-11 NOTE — Group Note (Signed)
 Date:  07/11/2024 Time:  8:56 PM  Group Topic/Focus:  Making Healthy Choices:   The focus of this group is to help patients identify negative/unhealthy choices they were using prior to admission and identify positive/healthier coping strategies to replace them upon discharge. Self Care:   The focus of this group is to help patients understand the importance of self-care in order to improve or restore emotional, physical, spiritual, interpersonal, and financial health. Wrap-Up Group:   The focus of this group is to help patients review their daily goal of treatment and discuss progress on daily workbooks.    Participation Level:  Active  Participation Quality:  Appropriate and Attentive  Affect:  Appropriate  Cognitive:  Alert, Appropriate, and Oriented  Insight: Appropriate and Good  Engagement in Group:  Engaged  Modes of Intervention:  Discussion and Support  Additional Comments:  N/A  Butler LITTIE Gelineau 07/11/2024, 8:56 PM

## 2024-07-11 NOTE — Progress Notes (Signed)
 Patient ID: Amanda [name], female   DOB: Jan 21, 2001, 24 y.o.   MRN: [id number] [facility] MD Progress Note  07/11/2024 10:51 PM Amanda [name]  MRN:  [id number]   Subjective:  Chart reviewed, case discussed in multidisciplinary meeting, patient seen during rounds.    07/11/2024: Patient is assessed today and is found lying in her bed with anxious presentation. Her affect remains bright and shy. She is alert and oriented x4, calm, cooperative, and offers no concerns today. She reports that her depressive symptoms have improved and only has anxiety regarding her discharge. Staff report is that she is compliant with her medication treatment and she denies side effects or concerns. She denies suicidal or homicidal ideation and does not appear to be internally preoccupied. She is awaiting safe disposition.   07/10/2024: Patient was seen laying in bed this morning for psychiatric reassessment during clinical rounds. Patient is alert and oriented X 4, calm, cooperative, and engages well in the interview. Patient reports she is feeling better, denies having any depression or anxiety. She reports good efficacy with taking her medications with no complaints of adverse reactions. Patient reports she is sleeping and eating well. Patient denies having any suicidal or homicidal ideations, or perceptual disturbances. DSS and SW are involved and following this patient. Will continue to monitor.   1/23: Patient found in her room coloring.  She smiles as clinical research associate approaches.  In discussion she discloses that she is anxious about going home and what comes next.  She is easily redirectable. She tells clinical research associate that she has been journaling every day. She denies any concerns.  We discussed changes in her medication including increasing lamotrigine  and Lexapro . She is agreeable to plan.  She denies any rash or discomfort.  She denies any medical concerns.  She denies SI HI and AVH.  DSS and social worker remain  involved.   1/22: Patient found in her room during rounds. She reported she was overwhelmed with the noise in the day room so she came to her room to decompress. She was given ear plugs by the recreational therapist. She denies SI, HI, and AVH. She endorses meeting with DSS and being able to talk with them about what was happening at home. She expresses ongoing anxiety about returning to her home with her mother. Patient presents as cognitively younger than age and is pleasant on contact. Denies all other concerns.      Past Psychiatric History: see h&P Family History:  Family History  Problem Relation Age of Onset   Diabetes Mother    Cancer Paternal Grandmother    Social History:  Social History   Substance and Sexual Activity  Alcohol Use Never     Social History   Substance and Sexual Activity  Drug Use Never    Social History   Socioeconomic History   Marital status: Single    Spouse name: Not on file   Number of children: Not on file   Years of education: Not on file   Highest education level: Not on file  Occupational History   Not on file  Tobacco Use   Smoking status: Never   Smokeless tobacco: Never  Vaping Use   Vaping status: Some Days   Substances: Nicotine , Flavoring  Substance and Sexual Activity   Alcohol use: Never   Drug use: Never   Sexual activity: Not Currently  Other Topics Concern   Not on file  Social History Narrative   Not on file   Social Drivers  of Health   Tobacco Use: Low Risk (07/08/2024)   Patient History    Smoking Tobacco Use: Never    Smokeless Tobacco Use: Never    Passive Exposure: Not on file  Financial Resource Strain: Not on file  Food Insecurity: Food Insecurity Present (07/06/2024)   Epic    Worried About Programme Researcher, Broadcasting/film/video in the Last Year: Sometimes true    Ran Out of Food in the Last Year: Sometimes true  Transportation Needs: No Transportation Needs (07/06/2024)   Epic    Lack of Transportation (Medical): No     Lack of Transportation (Non-Medical): No  Physical Activity: Not on file  Stress: Not on file  Social Connections: Not on file  Depression (EYV7-0): Not on file  Alcohol Screen: Low Risk (07/06/2024)   Alcohol Screen    Last Alcohol Screening Score (AUDIT): 0  Housing: High Risk (07/06/2024)   Epic    Unable to Pay for Housing in the Last Year: Yes    Number of Times Moved in the Last Year: 0    Homeless in the Last Year: No  Utilities: At Risk (07/06/2024)   Epic    Threatened with loss of utilities: Yes  Health Literacy: Not on file   Past Medical History:  Past Medical History:  Diagnosis Date   Asthma    Schizophrenia (HCC)     Past Surgical History:  Procedure Laterality Date   TONSILLECTOMY      Current Medications: Current Facility-Administered Medications  Medication Dose Route Frequency Provider Last Rate Last Admin   acetaminophen  (TYLENOL ) tablet 650 mg  650 mg Oral Q6H PRN Nkwenti, Doris, NP       alum & mag hydroxide-simeth (MAALOX/MYLANTA) 200-200-20 MG/5ML suspension 30 mL  30 mL Oral Q4H PRN Nkwenti, Doris, NP       haloperidol  (HALDOL ) tablet 5 mg  5 mg Oral TID PRN Tex Drilling, NP       And   diphenhydrAMINE  (BENADRYL ) capsule 50 mg  50 mg Oral TID PRN Tex Drilling, NP       haloperidol  lactate (HALDOL ) injection 5 mg  5 mg Intramuscular TID PRN Tex Drilling, NP       And   diphenhydrAMINE  (BENADRYL ) injection 50 mg  50 mg Intramuscular TID PRN Tex Drilling, NP       And   LORazepam  (ATIVAN ) injection 2 mg  2 mg Intramuscular TID PRN Tex Drilling, NP       haloperidol  lactate (HALDOL ) injection 10 mg  10 mg Intramuscular TID PRN Tex Drilling, NP       And   diphenhydrAMINE  (BENADRYL ) injection 50 mg  50 mg Intramuscular TID PRN Tex Drilling, NP       And   LORazepam  (ATIVAN ) injection 2 mg  2 mg Intramuscular TID PRN Tex Drilling, NP       escitalopram  (LEXAPRO ) tablet 10 mg  10 mg Oral Daily May, Tanya, NP   10 mg at 07/11/24 0935    hydrOXYzine  (ATARAX ) tablet 25 mg  25 mg Oral TID PRN Tex Drilling, NP   25 mg at 07/06/24 2125   lamoTRIgine  (LAMICTAL ) tablet 50 mg  50 mg Oral Daily May, Tanya, NP   50 mg at 07/11/24 0935   magnesium  hydroxide (MILK OF MAGNESIA) suspension 30 mL  30 mL Oral Daily PRN Tex Drilling, NP       nicotine  (NICODERM CQ  - dosed in mg/24 hours) patch 21 mg  21 mg Transdermal Daily Jadapalle,  Sree, MD   21 mg at 07/10/24 9053   traZODone  (DESYREL ) tablet 25 mg  25 mg Oral QHS PRN May, Tanya, NP   25 mg at 07/09/24 2124    Lab Results: No results found for this or any previous visit (from the past 48 hours).  Blood Alcohol level:  Lab Results  Component Value Date   [facility] <15 07/05/2024   ETH <10 08/19/2022    Metabolic Disorder Labs: No results found for: HGBA1C, MPG No results found for: PROLACTIN No results found for: CHOL, TRIG, HDL, CHOLHDL, VLDL, LDLCALC  Physical Findings: AIMS:  , ,  ,  ,    CIWA:    COWS:      Psychiatric Specialty Exam:  Presentation  General Appearance:  Appropriate for Environment  Eye Contact: Fleeting  Speech: Clear and Coherent  Speech Volume: Normal    Mood and Affect  Mood: Anxious  Affect: Congruent   Thought Process  Thought Processes: Coherent  Orientation:Full (Time, Place and Person)  Thought Content:WDL  Hallucinations:Hallucinations: None  Ideas of Reference:None  Suicidal Thoughts:Suicidal Thoughts: No  Homicidal Thoughts:Homicidal Thoughts: No   Sensorium  Memory: Immediate Good; Recent Good; Remote Good  Judgment: Good  Insight: Fair   Art Therapist  Concentration: Fair  Attention Span: Fair  Recall: Good  Fund of Knowledge: Fair  Language: Good   Psychomotor Activity  Psychomotor Activity: Psychomotor Activity: Normal  Musculoskeletal: Strength & Muscle Tone: within normal limits Gait & Station: normal Assets  Assets: Desire for  Improvement    Physical Exam: Physical Exam Review of Systems  Constitutional: Negative.   HENT: Negative.    Eyes: Negative.   Respiratory: Negative.    Cardiovascular: Negative.   Gastrointestinal: Negative.   Genitourinary: Negative.   Musculoskeletal: Negative.   Skin: Negative.   Neurological: Negative.   Endo/Heme/Allergies: Negative.   Psychiatric/Behavioral:  The patient is nervous/anxious.    Blood pressure 110/68, pulse 72, temperature 98.1 F (36.7 C), temperature source Oral, resp. rate 16, height 5' 10 (1.778 m), weight 114.4 kg, SpO2 99%. Body mass index is 36.19 kg/m.  Diagnosis: Principal Problem:   MDD (major depressive disorder), recurrent episode, severe (HCC)   PLAN: Safety and Monitoring:  -- Voluntary admission to inpatient psychiatric unit for safety, stabilization and treatment  -- Daily contact with patient to assess and evaluate symptoms and progress in treatment  -- Patient's case to be discussed in multi-disciplinary team meeting  -- Observation Level : q15 minute checks  -- Vital signs:  q12 hours  -- Precautions: suicide, elopement, and assault -- Encouraged patient to participate in unit milieu and in scheduled group therapies  2. Psychiatric Treatment:  Scheduled Medications: Continue current medications    -- The risks/benefits/side-effects/alternatives to this medication were discussed in detail with the patient and time was given for questions. The patient consents to medication trial.  3. Medical Issues Being Addressed:  none   4. Discharge Planning:   -- Social work and case management to assist with discharge planning and identification of hospital follow-up needs prior to discharge  -- Estimated LOS: 3-4 days  Daine KATHEE Ober, NP 07/11/2024, 10:51 PM

## 2024-07-11 NOTE — Progress Notes (Signed)
" °   07/11/24 0323  Psych Admission Type (Psych Patients Only)  Admission Status Involuntary  Psychosocial Assessment  Patient Complaints Depression  Eye Contact Brief  Facial Expression Fixed smile  Affect Appropriate to circumstance  Speech Soft  Interaction Other (Comment);Cautious (Childlike)  Motor Activity Slow  Appearance/Hygiene Unremarkable  Behavior Characteristics Cooperative  Mood Anxious;Pleasant  Thought Process  Coherency Circumstantial  Content Blaming others  Delusions None reported or observed  Perception WDL  Hallucination None reported or observed  Judgment Limited  Confusion None  Danger to Self  Current suicidal ideation? Denies  Self-Injurious Behavior No self-injurious ideation or behavior indicators observed or expressed   Agreement Not to Harm Self Yes  Description of Agreement Verbal  Danger to Others  Danger to Others None reported or observed    "

## 2024-07-11 NOTE — Plan of Care (Signed)
 Jemma Adella Manolis is a 24 y.o. female patient. No diagnosis found. Past Medical History:  Diagnosis Date   Asthma    Schizophrenia (HCC)    Current Facility-Administered Medications  Medication Dose Route Frequency Provider Last Rate Last Admin   acetaminophen  (TYLENOL ) tablet 650 mg  650 mg Oral Q6H PRN Nkwenti, Doris, NP       alum & mag hydroxide-simeth (MAALOX/MYLANTA) 200-200-20 MG/5ML suspension 30 mL  30 mL Oral Q4H PRN Nkwenti, Doris, NP       haloperidol  (HALDOL ) tablet 5 mg  5 mg Oral TID PRN Tex Drilling, NP       And   diphenhydrAMINE  (BENADRYL ) capsule 50 mg  50 mg Oral TID PRN Tex Drilling, NP       haloperidol  lactate (HALDOL ) injection 5 mg  5 mg Intramuscular TID PRN Tex Drilling, NP       And   diphenhydrAMINE  (BENADRYL ) injection 50 mg  50 mg Intramuscular TID PRN Tex Drilling, NP       And   LORazepam  (ATIVAN ) injection 2 mg  2 mg Intramuscular TID PRN Tex Drilling, NP       haloperidol  lactate (HALDOL ) injection 10 mg  10 mg Intramuscular TID PRN Tex Drilling, NP       And   diphenhydrAMINE  (BENADRYL ) injection 50 mg  50 mg Intramuscular TID PRN Tex Drilling, NP       And   LORazepam  (ATIVAN ) injection 2 mg  2 mg Intramuscular TID PRN Tex Drilling, NP       escitalopram  (LEXAPRO ) tablet 10 mg  10 mg Oral Daily May, Tanya, NP   10 mg at 07/10/24 9053   hydrOXYzine  (ATARAX ) tablet 25 mg  25 mg Oral TID PRN Tex Drilling, NP   25 mg at 07/06/24 2125   lamoTRIgine  (LAMICTAL ) tablet 50 mg  50 mg Oral Daily May, Tanya, NP   50 mg at 07/10/24 9052   magnesium  hydroxide (MILK OF MAGNESIA) suspension 30 mL  30 mL Oral Daily PRN Tex Drilling, NP       nicotine  (NICODERM CQ  - dosed in mg/24 hours) patch 21 mg  21 mg Transdermal Daily Jadapalle, Sree, MD   21 mg at 07/10/24 9053   traZODone  (DESYREL ) tablet 25 mg  25 mg Oral QHS PRN May, Tanya, NP   25 mg at 07/09/24 2124   Allergies[1] Principal Problem:   MDD (major depressive disorder), recurrent  episode, severe (HCC)  Blood pressure 108/64, pulse 63, temperature 97.7 F (36.5 C), temperature source Oral, resp. rate 16, height 5' 10 (1.778 m), weight 114.4 kg, SpO2 99%.    Tynika Luddy B Dijon Kohlman 07/11/2024      [1]  Allergies Allergen Reactions   Biaxin [Clarithromycin] Hives

## 2024-07-11 NOTE — Group Note (Signed)
 Date:  07/11/2024 Time:  11:58 AM  Group Topic/Focus:  Goals Group:   The focus of this group is to help patients establish daily goals to achieve during treatment and discuss how the patient can incorporate goal setting into their daily lives to aide in recovery.    Participation Level:  Active  Participation Quality:  Appropriate  Affect:  Appropriate  Cognitive:  Appropriate  Insight: Appropriate  Engagement in Group:  Engaged  Modes of Intervention:  Activity  Additional Comments:    Amanda [name] 07/11/2024, 11:58 AM

## 2024-07-11 NOTE — Plan of Care (Signed)
  Problem: Education: Goal: Mental status will improve Outcome: Progressing   

## 2024-07-12 NOTE — Group Note (Signed)
 Date:  07/12/2024 Time:  10:44 AM  Group Topic/Focus:  Orientation:   The focus of this group is to educate the patient on the purpose and policies of crisis stabilization and provide a format to answer questions about their admission.  The group details unit policies and expectations of patients while admitted.    Participation Level:  Did Not Attend    Amanda [name] June 07/12/2024, 10:44 AM

## 2024-07-12 NOTE — Plan of Care (Signed)
   Problem: Education: Goal: Emotional status will improve Outcome: Progressing Goal: Mental status will improve Outcome: Progressing Goal: Verbalization of understanding the information provided will improve Outcome: Progressing   Problem: Activity: Goal: Interest or engagement in activities will improve Outcome: Progressing Goal: Sleeping patterns will improve Outcome: Progressing

## 2024-07-12 NOTE — Progress Notes (Signed)
" °   07/11/24 2200  Psych Admission Type (Psych Patients Only)  Admission Status Involuntary  Psychosocial Assessment  Patient Complaints None  Eye Contact Fair  Facial Expression Animated  Affect Appropriate to circumstance  Speech Logical/coherent  Interaction Assertive  Motor Activity Slow  Appearance/Hygiene Unremarkable  Behavior Characteristics Appropriate to situation  Mood Pleasant  Thought Process  Coherency WDL  Content WDL  Delusions None reported or observed  Perception WDL  Hallucination None reported or observed  Judgment Limited  Confusion None  Danger to Self  Current suicidal ideation? Denies  Self-Injurious Behavior No self-injurious ideation or behavior indicators observed or expressed   Agreement Not to Harm Self Yes  Description of Agreement verbal  Danger to Others  Danger to Others None reported or observed    "

## 2024-07-12 NOTE — Group Note (Signed)
 Date:  07/12/2024 Time:  9:19 PM  Group Topic/Focus:  Coping With Mental Health Crisis:   The purpose of this group is to help patients identify strategies for coping with mental health crisis.  Group discusses possible causes of crisis and ways to manage them effectively.    Participation Level:  Active  Participation Quality:  Appropriate  Affect:  Appropriate  Cognitive:  Appropriate  Insight: Appropriate  Engagement in Group:  Engaged  Modes of Intervention:  Socialization  Additional Comments:    Amanda [name] 07/12/2024, 9:19 PM

## 2024-07-12 NOTE — Progress Notes (Signed)
" °   07/12/24 1129  Psych Admission Type (Psych Patients Only)  Admission Status Involuntary  Psychosocial Assessment  Patient Complaints None  Eye Contact Fair  Facial Expression Animated  Affect Appropriate to circumstance  Speech Logical/coherent  Interaction Assertive  Motor Activity Slow  Appearance/Hygiene Unremarkable  Behavior Characteristics Cooperative;Appropriate to situation  Mood Pleasant  Thought Process  Coherency WDL  Content WDL  Delusions None reported or observed  Perception WDL  Hallucination None reported or observed  Judgment Limited  Confusion None  Danger to Self  Current suicidal ideation? Denies  Self-Injurious Behavior No self-injurious ideation or behavior indicators observed or expressed   Agreement Not to Harm Self Yes  Description of Agreement Verbal  Danger to Others  Danger to Others None reported or observed    "

## 2024-07-12 NOTE — Group Note (Signed)
"                                                 [facility] LCSW Group Therapy Note    Group Date: 07/12/2024 Start Time: 1200 End Time: 1215  Type of Therapy and Topic:  Group Therapy:  Overcoming Obstacles  Participation Level:  [facility] PARTICIPATION LEVEL: Active  Mood:  Description of Group:   In this group patients will be encouraged to explore what they see as obstacles to their own wellness and recovery. They will be guided to discuss their thoughts, feelings, and behaviors related to these obstacles. The group will process together ways to cope with barriers, with attention given to specific choices patients can make. Each patient will be challenged to identify changes they are motivated to make in order to overcome their obstacles. This group will be process-oriented, with patients participating in exploration of their own experiences as well as giving and receiving support and challenge from other group members.  Therapeutic Goals: 1. Patient will identify personal and current obstacles as they relate to admission. 2. Patient will identify barriers that currently interfere with their wellness or overcoming obstacles.  3. Patient will identify feelings, thought process and behaviors related to these barriers. 4. Patient will identify two changes they are willing to make to overcome these obstacles:    Summary of Patient Progress   CSW had RN assist in printing group packet for patient.  Group packet provided due to inclement weather.    Therapeutic Modalities:   Cognitive Behavioral Therapy Solution Focused Therapy Motivational Interviewing Relapse Prevention Therapy   [name], LCSW "

## 2024-07-12 NOTE — Progress Notes (Signed)
 Patient ID: Amanda [name], female   DOB: 2000-10-26, 24 y.o.   MRN: [id number] [facility] MD Progress Note  07/12/2024 10:02 AM Amanda [name]  MRN:  [id number]   Subjective:  Chart reviewed, case discussed in multidisciplinary meeting, patient seen during rounds.   07/12/2024: Patient seen on rounds today.  Patient noted to be lying in bed, but calm and cooperative with psychiatry team on rounds today.  Patient rated her depressive and anxiety symptoms today as 0 out of 10.  She reported doing good.  She denied any current noted side effects to her current medication regimen.  She reported sleeping good and eating good as well.  Patient denied current suicidal or homicidal ideations as well as auditory or visual hallucinations.  Per review of chart, DSS and social work are involved and patient is currently awaiting safe disposition plan.   07/11/2024: Patient is assessed today and is found lying in her bed with anxious presentation. Her affect remains bright and shy. She is alert and oriented x4, calm, cooperative, and offers no concerns today. She reports that her depressive symptoms have improved and only has anxiety regarding her discharge. Staff report is that she is compliant with her medication treatment and she denies side effects or concerns. She denies suicidal or homicidal ideation and does not appear to be internally preoccupied. She is awaiting safe disposition.   07/10/2024: Patient was seen laying in bed this morning for psychiatric reassessment during clinical rounds. Patient is alert and oriented X 4, calm, cooperative, and engages well in the interview. Patient reports she is feeling better, denies having any depression or anxiety. She reports good efficacy with taking her medications with no complaints of adverse reactions. Patient reports she is sleeping and eating well. Patient denies having any suicidal or homicidal ideations, or perceptual disturbances. DSS and SW are  involved and following this patient. Will continue to monitor.   1/23: Patient found in her room coloring.  She smiles as clinical research associate approaches.  In discussion she discloses that she is anxious about going home and what comes next.  She is easily redirectable. She tells clinical research associate that she has been journaling every day. She denies any concerns.  We discussed changes in her medication including increasing lamotrigine  and Lexapro . She is agreeable to plan.  She denies any rash or discomfort.  She denies any medical concerns.  She denies SI HI and AVH.  DSS and social worker remain involved.   1/22: Patient found in her room during rounds. She reported she was overwhelmed with the noise in the day room so she came to her room to decompress. She was given ear plugs by the recreational therapist. She denies SI, HI, and AVH. She endorses meeting with DSS and being able to talk with them about what was happening at home. She expresses ongoing anxiety about returning to her home with her mother. Patient presents as cognitively younger than age and is pleasant on contact. Denies all other concerns.      Past Psychiatric History: see h&P Family History:  Family History  Problem Relation Age of Onset   Diabetes Mother    Cancer Paternal Grandmother    Social History:  Social History   Substance and Sexual Activity  Alcohol Use Never     Social History   Substance and Sexual Activity  Drug Use Never    Social History   Socioeconomic History   Marital status: Single    Spouse name: Not on file  Number of children: Not on file   Years of education: Not on file   Highest education level: Not on file  Occupational History   Not on file  Tobacco Use   Smoking status: Never   Smokeless tobacco: Never  Vaping Use   Vaping status: Some Days   Substances: Nicotine , Flavoring  Substance and Sexual Activity   Alcohol use: Never   Drug use: Never   Sexual activity: Not Currently  Other Topics Concern    Not on file  Social History Narrative   Not on file   Social Drivers of Health   Tobacco Use: Low Risk (07/08/2024)   Patient History    Smoking Tobacco Use: Never    Smokeless Tobacco Use: Never    Passive Exposure: Not on file  Financial Resource Strain: Not on file  Food Insecurity: Food Insecurity Present (07/06/2024)   Epic    Worried About Programme Researcher, Broadcasting/film/video in the Last Year: Sometimes true    [employer] of Food in the Last Year: Sometimes true  Transportation Needs: No Transportation Needs (07/06/2024)   Epic    Lack of Transportation (Medical): No    Lack of Transportation (Non-Medical): No  Physical Activity: Not on file  Stress: Not on file  Social Connections: Not on file  Depression (EYV7-0): Not on file  Alcohol Screen: Low Risk (07/06/2024)   Alcohol Screen    Last Alcohol Screening Score (AUDIT): 0  Housing: High Risk (07/06/2024)   Epic    Unable to Pay for Housing in the Last Year: Yes    Number of Times Moved in the Last Year: 0    Homeless in the Last Year: No  Utilities: At Risk (07/06/2024)   Epic    Threatened with loss of utilities: Yes  Health Literacy: Not on file   Past Medical History:  Past Medical History:  Diagnosis Date   Asthma    Schizophrenia (HCC)     Past Surgical History:  Procedure Laterality Date   TONSILLECTOMY      Current Medications: Current Facility-Administered Medications  Medication Dose Route Frequency Provider Last Rate Last Admin   acetaminophen  (TYLENOL ) tablet 650 mg  650 mg Oral Q6H PRN Nkwenti, Doris, NP       alum & mag hydroxide-simeth (MAALOX/MYLANTA) 200-200-20 MG/5ML suspension 30 mL  30 mL Oral Q4H PRN Nkwenti, Doris, NP       haloperidol  (HALDOL ) tablet 5 mg  5 mg Oral TID PRN Tex Drilling, NP       And   diphenhydrAMINE  (BENADRYL ) capsule 50 mg  50 mg Oral TID PRN Tex Drilling, NP       haloperidol  lactate (HALDOL ) injection 5 mg  5 mg Intramuscular TID PRN Tex Drilling, NP       And    diphenhydrAMINE  (BENADRYL ) injection 50 mg  50 mg Intramuscular TID PRN Tex Drilling, NP       And   LORazepam  (ATIVAN ) injection 2 mg  2 mg Intramuscular TID PRN Tex Drilling, NP       haloperidol  lactate (HALDOL ) injection 10 mg  10 mg Intramuscular TID PRN Tex Drilling, NP       And   diphenhydrAMINE  (BENADRYL ) injection 50 mg  50 mg Intramuscular TID PRN Tex Drilling, NP       And   LORazepam  (ATIVAN ) injection 2 mg  2 mg Intramuscular TID PRN Tex Drilling, NP       escitalopram  (LEXAPRO ) tablet 10 mg  10  mg Oral Daily May, Tanya, NP   10 mg at 07/12/24 9244   hydrOXYzine  (ATARAX ) tablet 25 mg  25 mg Oral TID PRN Tex Drilling, NP   25 mg at 07/06/24 2125   lamoTRIgine  (LAMICTAL ) tablet 50 mg  50 mg Oral Daily May, Tanya, NP   50 mg at 07/12/24 0755   magnesium  hydroxide (MILK OF MAGNESIA) suspension 30 mL  30 mL Oral Daily PRN Tex Drilling, NP       nicotine  (NICODERM CQ  - dosed in mg/24 hours) patch 21 mg  21 mg Transdermal Daily Jadapalle, Sree, MD   21 mg at 07/12/24 0755   traZODone  (DESYREL ) tablet 25 mg  25 mg Oral QHS PRN May, Tanya, NP   25 mg at 07/09/24 2124    Lab Results: No results found for this or any previous visit (from the past 48 hours).  Blood Alcohol level:  Lab Results  Component Value Date   [facility] <15 07/05/2024   ETH <10 08/19/2022    Metabolic Disorder Labs: No results found for: HGBA1C, MPG No results found for: PROLACTIN No results found for: CHOL, TRIG, HDL, CHOLHDL, VLDL, LDLCALC  Physical Findings: AIMS:  , ,  ,  ,    CIWA:    COWS:      Psychiatric Specialty Exam:  Presentation  General Appearance:  Appropriate for Environment  Eye Contact: Fleeting  Speech: Clear and Coherent  Speech Volume: Normal    Mood and Affect  Mood: Anxious  Affect: Congruent   Thought Process  Thought Processes: Coherent  Orientation:Full (Time, Place and Person)  Thought  Content:WDL  Hallucinations:Hallucinations: None  Ideas of Reference:None  Suicidal Thoughts:Suicidal Thoughts: No  Homicidal Thoughts:Homicidal Thoughts: No   Sensorium  Memory: Immediate Good; Recent Good; Remote Good  Judgment: Good  Insight: Fair   Art Therapist  Concentration: Fair  Attention Span: Fair  Recall: Good  Fund of Knowledge: Fair  Language: Good   Psychomotor Activity  Psychomotor Activity: Psychomotor Activity: Normal  Musculoskeletal: Strength & Muscle Tone: within normal limits Gait & Station: normal Assets  Assets: Desire for Improvement    Physical Exam: Physical Exam Review of Systems  Constitutional: Negative.   HENT: Negative.    Eyes: Negative.   Respiratory: Negative.    Cardiovascular: Negative.   Gastrointestinal: Negative.   Genitourinary: Negative.   Musculoskeletal: Negative.   Skin: Negative.   Neurological: Negative.   Endo/Heme/Allergies: Negative.   Psychiatric/Behavioral:  The patient is nervous/anxious.    Blood pressure 100/63, pulse 92, temperature 98.2 F (36.8 C), temperature source Oral, resp. rate 18, height 5' 10 (1.778 m), weight 114.4 kg, SpO2 98%. Body mass index is 36.19 kg/m.  Diagnosis: Principal Problem:   MDD (major depressive disorder), recurrent episode, severe (HCC)   PLAN: Safety and Monitoring:  -- Voluntary admission to inpatient psychiatric unit for safety, stabilization and treatment  -- Daily contact with patient to assess and evaluate symptoms and progress in treatment  -- Patient's case to be discussed in multi-disciplinary team meeting  -- Observation Level : q15 minute checks  -- Vital signs:  q12 hours  -- Precautions: suicide, elopement, and assault -- Encouraged patient to participate in unit milieu and in scheduled group therapies  2. Psychiatric Treatment:  Scheduled Medications: Continue current medications    -- The  risks/benefits/side-effects/alternatives to this medication were discussed in detail with the patient and time was given for questions. The patient consents to medication trial.  3. Medical Issues Being Addressed:  -  No acute concerns reported    4. Discharge Planning:   -- Social work and case management to assist with discharge planning and identification of hospital follow-up needs prior to discharge  -- Estimated LOS: 3-4 days  A. Claudene, NP This note was created using Scientist, clinical (histocompatibility and immunogenetics). Please excuse any inadvertent transcription errors. Case was discussed with supervising physician Dr. Jadapalle who is agreeable with current plan.

## 2024-07-12 NOTE — BH IP Treatment Plan (Signed)
 Interdisciplinary Treatment and Diagnostic Plan Update  07/12/2024 Time of Session: 2:00PM Amanda [name] MRN: [id number]  Principal Diagnosis: MDD (major depressive disorder), recurrent episode, severe (HCC)  Secondary Diagnoses: Principal Problem:   MDD (major depressive disorder), recurrent episode, severe (HCC)   Current Medications:  Current Facility-Administered Medications  Medication Dose Route Frequency Provider Last Rate Last Admin   acetaminophen  (TYLENOL ) tablet 650 mg  650 mg Oral Q6H PRN Nkwenti, Doris, NP       alum & mag hydroxide-simeth (MAALOX/MYLANTA) 200-200-20 MG/5ML suspension 30 mL  30 mL Oral Q4H PRN Nkwenti, Doris, NP       haloperidol  (HALDOL ) tablet 5 mg  5 mg Oral TID PRN Tex Drilling, NP       And   diphenhydrAMINE  (BENADRYL ) capsule 50 mg  50 mg Oral TID PRN Tex Drilling, NP       haloperidol  lactate (HALDOL ) injection 5 mg  5 mg Intramuscular TID PRN Tex Drilling, NP       And   diphenhydrAMINE  (BENADRYL ) injection 50 mg  50 mg Intramuscular TID PRN Tex Drilling, NP       And   LORazepam  (ATIVAN ) injection 2 mg  2 mg Intramuscular TID PRN Tex Drilling, NP       haloperidol  lactate (HALDOL ) injection 10 mg  10 mg Intramuscular TID PRN Tex Drilling, NP       And   diphenhydrAMINE  (BENADRYL ) injection 50 mg  50 mg Intramuscular TID PRN Tex Drilling, NP       And   LORazepam  (ATIVAN ) injection 2 mg  2 mg Intramuscular TID PRN Tex Drilling, NP       escitalopram  (LEXAPRO ) tablet 10 mg  10 mg Oral Daily May, Tanya, NP   10 mg at 07/12/24 9244   hydrOXYzine  (ATARAX ) tablet 25 mg  25 mg Oral TID PRN Tex Drilling, NP   25 mg at 07/06/24 2125   lamoTRIgine  (LAMICTAL ) tablet 50 mg  50 mg Oral Daily May, Tanya, NP   50 mg at 07/12/24 9244   magnesium  hydroxide (MILK OF MAGNESIA) suspension 30 mL  30 mL Oral Daily PRN Tex Drilling, NP       nicotine  (NICODERM CQ  - dosed in mg/24 hours) patch 21 mg  21 mg Transdermal Daily Jadapalle,  Sree, MD   21 mg at 07/12/24 0755   traZODone  (DESYREL ) tablet 25 mg  25 mg Oral QHS PRN May, Tanya, NP   25 mg at 07/09/24 2124   PTA Medications: Medications Prior to Admission  Medication Sig Dispense Refill Last Dose/Taking   busPIRone (BUSPAR) 5 MG tablet Take 5 mg by mouth 3 (three) times daily.      lithium  carbonate 300 MG capsule Take 300 mg by mouth daily.      naproxen sodium (ALEVE) 220 MG tablet Take 440 mg by mouth 2 (two) times daily as needed (For pain).       Patient Stressors:    Patient Strengths:    Treatment Modalities: Medication Management, Group therapy, Case management,  1 to 1 session with clinician, Psychoeducation, Recreational therapy.   Physician Treatment Plan for Primary Diagnosis: MDD (major depressive disorder), recurrent episode, severe (HCC) Long Term Goal(s): Improvement in symptoms so as ready for discharge   Short Term Goals: Ability to identify changes in lifestyle to reduce recurrence of condition will improve Ability to verbalize feelings will improve Ability to disclose and discuss suicidal ideas Ability to identify and develop effective coping behaviors will improve Ability to  maintain clinical measurements within normal limits will improve Ability to identify triggers associated with substance abuse/mental health issues will improve  Medication Management: Evaluate patient's response, side effects, and tolerance of medication regimen.  Therapeutic Interventions: 1 to 1 sessions, Unit Group sessions and Medication administration.  Evaluation of Outcomes: Progressing  Physician Treatment Plan for Secondary Diagnosis: Principal Problem:   MDD (major depressive disorder), recurrent episode, severe (HCC)  Long Term Goal(s): Improvement in symptoms so as ready for discharge   Short Term Goals: Ability to identify changes in lifestyle to reduce recurrence of condition will improve Ability to verbalize feelings will improve Ability to  disclose and discuss suicidal ideas Ability to identify and develop effective coping behaviors will improve Ability to maintain clinical measurements within normal limits will improve Ability to identify triggers associated with substance abuse/mental health issues will improve     Medication Management: Evaluate patient's response, side effects, and tolerance of medication regimen.  Therapeutic Interventions: 1 to 1 sessions, Unit Group sessions and Medication administration.  Evaluation of Outcomes: Progressing   RN Treatment Plan for Primary Diagnosis: MDD (major depressive disorder), recurrent episode, severe (HCC) Long Term Goal(s): Knowledge of disease and therapeutic regimen to maintain health will improve  Short Term Goals: Ability to demonstrate self-control, Ability to participate in decision making will improve, Ability to verbalize feelings will improve, Ability to disclose and discuss suicidal ideas, Ability to identify and develop effective coping behaviors will improve, and Compliance with prescribed medications will improve  Medication Management: RN will administer medications as ordered by provider, will assess and evaluate patient's response and provide education to patient for prescribed medication. RN will report any adverse and/or side effects to prescribing provider.  Therapeutic Interventions: 1 on 1 counseling sessions, Psychoeducation, Medication administration, Evaluate responses to treatment, Monitor vital signs and CBGs as ordered, Perform/monitor CIWA, COWS, AIMS and Fall Risk screenings as ordered, Perform wound care treatments as ordered.  Evaluation of Outcomes: Progressing   LCSW Treatment Plan for Primary Diagnosis: MDD (major depressive disorder), recurrent episode, severe (HCC) Long Term Goal(s): Safe transition to appropriate next level of care at discharge, Engage patient in therapeutic group addressing interpersonal concerns.  Short Term Goals:  Engage patient in aftercare planning with referrals and resources, Increase social support, Increase ability to appropriately verbalize feelings, Increase emotional regulation, Facilitate acceptance of mental health diagnosis and concerns, and Increase skills for wellness and recovery  Therapeutic Interventions: Assess for all discharge needs, 1 to 1 time with Social worker, Explore available resources and support systems, Assess for adequacy in community support network, Educate family and significant other(s) on suicide prevention, Complete Psychosocial Assessment, Interpersonal group therapy.  Evaluation of Outcomes: Progressing   Progress in Treatment: Attending groups: Yes. Participating in groups: Yes. Taking medication as prescribed: Yes. Toleration medication: Yes. Family/Significant other contact made: No, will contact:  once permission is granted Patient understands diagnosis: Yes. Discussing patient identified problems/goals with staff: Yes. Medical problems stabilized or resolved: Yes. Denies suicidal/homicidal ideation: Yes. Issues/concerns per patient self-inventory: No. Other: none  New problem(s) identified: No, Describe:  none  Update 07/12/2024:  No changes at this time.   New Short Term/Long Term Goal(s): detox, elimination of symptoms of psychosis, medication management for mood stabilization; elimination of SI thoughts; development of comprehensive mental wellness/sobriety plan. Update 07/12/2024:  No changes at this time.   Patient Goals:  medication and get a therapist and psychiatrist Update 07/12/2024:  No changes at this time.   Discharge Plan or Barriers: CSW  to assist in the development of appropriate discharge plans. Update 07/12/2024: CSW to reach out to [facility] DSS to discern if any placement assistance is available.    Reason for Continuation of Hospitalization: Anxiety Depression Medication stabilization Suicidal ideation   Estimated Length of  Stay:  1-7 days Update 07/12/2024:  TBD  Last 3 [place] Suicide Severity Risk Score: Flowsheet Row Admission (Current) from 07/06/2024 in [facility] ED from 07/04/2024 in [facility] from 10/24/2023 in [facility])  C-SSRS RISK CATEGORY No Risk No Risk Error: Q2 is Yes, you must answer 3, 4, and 5    Last PHQ 2/9 Scores:    07/21/2020    2:04 PM  Depression screen PHQ 2/9  Decreased Interest 3  Down, Depressed, Hopeless 3  PHQ - 2 Score 6  Altered sleeping 0  Tired, decreased energy 3  Change in appetite 0  Feeling bad or failure about yourself  3  Trouble concentrating 0  Moving slowly or fidgety/restless 3  PHQ-9 Score 15      Data saved with a previous flowsheet row definition    Scribe for Treatment Team: [name], LCSW 07/12/2024 1:27 PM

## 2024-07-13 NOTE — Plan of Care (Signed)
?  Problem: Education: ?Goal: Knowledge of [facility] information/materials will improve ?Outcome: Progressing ?Goal: Emotional status will improve ?Outcome: Progressing ?Goal: Mental status will improve ?Outcome: Progressing ?Goal: Verbalization of understanding the information provided will improve ?Outcome: Progressing ?  ?Problem: Activity: ?Goal: Interest or engagement in activities will improve ?Outcome: Progressing ?Goal: Sleeping patterns will improve ?Outcome: Progressing ?  ?Problem: Coping: ?Goal: Ability to verbalize frustrations and anger appropriately will improve ?Outcome: Progressing ?Goal: Ability to demonstrate self-control will improve ?Outcome: Progressing ?  ?Problem: Health Behavior/Discharge Planning: ?Goal: Identification of resources available to assist in meeting health care needs will improve ?Outcome: Progressing ?Goal: Compliance with treatment plan for underlying cause of condition will improve ?Outcome: Progressing ?  ?Problem: Physical Regulation: ?Goal: Ability to maintain clinical measurements within normal limits will improve ?Outcome: Progressing ?  ?Problem: Safety: ?Goal: Periods of time without injury will increase ?Outcome: Progressing ?  ?Problem: Education: ?Goal: Ability to state activities that reduce stress will improve ?Outcome: Progressing ?  ?Problem: Coping: ?Goal: Ability to identify and develop effective coping behavior will improve ?Outcome: Progressing ?  ?Problem: Self-Concept: ?Goal: Ability to identify factors that promote anxiety will improve ?Outcome: Progressing ?Goal: Level of anxiety will decrease ?Outcome: Progressing ?Goal: Ability to modify response to factors that promote anxiety will improve ?Outcome: Progressing ?  ?Problem: Education: ?Goal: Utilization of techniques to improve thought processes will improve ?Outcome: Progressing ?Goal: Knowledge of the prescribed therapeutic regimen will improve ?Outcome: Progressing ?  ?Problem:  Activity: ?Goal: Interest or engagement in leisure activities will improve ?Outcome: Progressing ?Goal: Imbalance in normal sleep/wake cycle will improve ?Outcome: Progressing ?  ?Problem: Coping: ?Goal: Coping ability will improve ?Outcome: Progressing ?Goal: Will verbalize feelings ?Outcome: Progressing ?  ?Problem: Health Behavior/Discharge Planning: ?Goal: Ability to make decisions will improve ?Outcome: Progressing ?Goal: Compliance with therapeutic regimen will improve ?Outcome: Progressing ?  ?Problem: Role Relationship: ?Goal: Will demonstrate positive changes in social behaviors and relationships ?Outcome: Progressing ?  ?Problem: Safety: ?Goal: Ability to disclose and discuss suicidal ideas will improve ?Outcome: Progressing ?Goal: Ability to identify and utilize support systems that promote safety will improve ?Outcome: Progressing ?  ?Problem: Self-Concept: ?Goal: Will verbalize positive feelings about self ?Outcome: Progressing ?Goal: Level of anxiety will decrease ?Outcome: Progressing ?  ?

## 2024-07-13 NOTE — Group Note (Signed)
 LCSW Group Therapy Note  Group Date: 07/13/2024 Start Time: 1210 End Time: 1300   Type of Therapy and Topic:  Group Therapy: Positive Affirmations  Participation Level:  Active   Description of Group:   This group addressed positive affirmation towards self and others.  Patients went around the room and identified two positive things about themselves and two positive things about a peer in the room.  Patients reflected on how it felt to share something positive with others, to identify positive things about themselves, and to hear positive things from others/ Patients were encouraged to have a daily reflection of positive characteristics or circumstances.   Therapeutic Goals: Patients will verbalize two of their positive qualities Patients will demonstrate empathy for others by stating two positive qualities about a peer in the group Patients will verbalize their feelings when voicing positive self affirmations and when voicing positive affirmations of others Patients will discuss the potential positive impact on their wellness/recovery of focusing on positive traits of self and others.  Summary of Patient Progress:  Patient actively engaged in the discussion and . Patient was able to identify positive affirmations about themselves as well as other group members. Patient demonstrated proficient insight into the subject matter, was respectful of peers, participated throughout the entire session.  Therapeutic Modalities:   Cognitive Behavioral Therapy Motivational Interviewing    Alveta CHRISTELLA Kerns, LCSWA 07/13/2024  2:09 PM

## 2024-07-13 NOTE — BHH Counselor (Signed)
 CSW spoke with Christopher with [facility] DSS, [phone].  He reports that he has not followed up with the patient's mother.  He reports no updates to the patient's case at this time.   Sherryle Margo, MSW, LCSW 07/13/2024 4:24 PM

## 2024-07-13 NOTE — Progress Notes (Signed)
" °   07/13/24 1400  Psych Admission Type (Psych Patients Only)  Admission Status Involuntary  Psychosocial Assessment  Patient Complaints None  Eye Contact Fair  Facial Expression Animated  Affect Appropriate to circumstance  Speech Logical/coherent  Interaction Assertive  Motor Activity Slow  Appearance/Hygiene Unremarkable  Behavior Characteristics Cooperative  Mood Pleasant  Aggressive Behavior  Effect No apparent injury  Thought Process  Coherency WDL  Content WDL  Delusions None reported or observed  Perception WDL  Hallucination None reported or observed  Judgment Limited  Confusion None  Danger to Self  Current suicidal ideation? Denies  Self-Injurious Behavior No self-injurious ideation or behavior indicators observed or expressed   Agreement Not to Harm Self Yes  Description of Agreement verbal  Danger to Others  Danger to Others None reported or observed    "

## 2024-07-13 NOTE — Plan of Care (Signed)
" °  Problem: Education: Goal: Knowledge of [facility] Education information/materials will improve Outcome: Progressing Goal: Emotional status will improve Outcome: Progressing Goal: Mental status will improve Outcome: Progressing Goal: Verbalization of understanding the information provided will improve Outcome: Progressing   Problem: Activity: Goal: Interest or engagement in activities will improve Outcome: Progressing   Problem: Activity: Goal: Interest or engagement in activities will improve Outcome: Progressing Goal: Sleeping patterns will improve Outcome: Progressing   Problem: Coping: Goal: Ability to verbalize frustrations and anger appropriately will improve Outcome: Progressing Goal: Ability to demonstrate self-control will improve Outcome: Progressing   "

## 2024-07-13 NOTE — Group Note (Signed)
 Date:  07/13/2024 Time:  10:04 AM  Group Topic/Focus:  Orientation:   The focus of this group is to educate the patient on the purpose and policies of crisis stabilization and provide a format to answer questions about their admission.  The group details unit policies and expectations of patients while admitted.    Participation Level:  Active  Participation Quality:  Appropriate  Affect:  Appropriate  Cognitive:  Appropriate  Insight: Appropriate  Engagement in Group:  Engaged  Modes of Intervention:  Activity  Additional Comments:    Amanda [name] June 07/13/2024, 10:04 AM

## 2024-07-13 NOTE — Group Note (Signed)
 Recreation Therapy Group Note   Group Topic:Coping Skills  Group Date: 07/13/2024 Start Time: 1000 End Time: 1040 Facilitators: Celestia Jeoffrey BRAVO, LRT, CTRS Location: Craft Room  Group Description: Mind Map.  Patient was provided a blank template of a diagram with 32 blank boxes in a tiered system, branching from the center (similar to a bubble chart). LRT directed patients to label the middle of the diagram Coping Skills. LRT and patients then came up with 8 different coping skills as examples. Pt were directed to record their coping skills in the 2nd tier boxes closest to the center.  Patients would then share their coping skills with the group as LRT wrote them out. LRT gave a handout of 99 different coping skills at the end of group.   Goal Area(s) Addressed: Patients will be able to define coping skills. Patient will identify new coping skills.  Patient will increase communication.   Affect/Mood: Appropriate   Participation Level: Active and Engaged   Participation Quality: Independent   Behavior: Calm and Cooperative   Speech/Thought Process: Coherent   Insight: Fair   Judgement: Good   Modes of Intervention: Clarification, Education, Worksheet, and Writing   Patient Response to Interventions:  Receptive   Education Outcome:  Acknowledges education   Clinical Observations/Individualized Feedback: Amanda [name] was active in their participation of session activities and group discussion. Pt identified color and draw as coping skills.    Plan: Continue to engage patient in RT group sessions 2-3x/week.   Jeoffrey BRAVO Celestia, LRT, CTRS 07/13/2024 10:52 AM
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
## 2024-07-13 NOTE — Group Note (Signed)
 Recreation Therapy Group Note   Group Topic:Other  Group Date: 07/13/2024 Start Time: 1530 End Time: 1535 Facilitators: Celestia Gauze E, LRT, CTRS  Group therapy was not conducted due to staffing restrictions. LRT was engaged in holding the board and completing required safety checks, while the other MHT on the unit was completing admissions and walking discharges up.   Plan: Continue to engage patient in RT group sessions 2-3x/week.   Gauze FORBES Celestia, LRT, CTRS 07/13/2024 5:13 PM

## 2024-07-13 NOTE — Group Note (Signed)
 Date:  07/13/2024 Time:  9:27 PM  Group Topic/Focus:  Wrap-Up Group:   The focus of this group is to help patients review their daily goal of treatment and discuss progress on daily workbooks.    Participation Level:  Did Not Attend  Participation Quality:  none  Affect:  none  Cognitive:  none  Insight: None  Engagement in Group:  None  Modes of Intervention:  none  Additional Comments:    Ginny JONETTA Galeazzi 07/13/2024, 9:27 PM

## 2024-07-14 NOTE — Plan of Care (Signed)
?  Problem: Education: ?Goal: Knowledge of [facility] information/materials will improve ?Outcome: Progressing ?Goal: Emotional status will improve ?Outcome: Progressing ?Goal: Mental status will improve ?Outcome: Progressing ?Goal: Verbalization of understanding the information provided will improve ?Outcome: Progressing ?  ?Problem: Activity: ?Goal: Interest or engagement in activities will improve ?Outcome: Progressing ?Goal: Sleeping patterns will improve ?Outcome: Progressing ?  ?Problem: Coping: ?Goal: Ability to verbalize frustrations and anger appropriately will improve ?Outcome: Progressing ?Goal: Ability to demonstrate self-control will improve ?Outcome: Progressing ?  ?Problem: Health Behavior/Discharge Planning: ?Goal: Identification of resources available to assist in meeting health care needs will improve ?Outcome: Progressing ?Goal: Compliance with treatment plan for underlying cause of condition will improve ?Outcome: Progressing ?  ?Problem: Physical Regulation: ?Goal: Ability to maintain clinical measurements within normal limits will improve ?Outcome: Progressing ?  ?Problem: Safety: ?Goal: Periods of time without injury will increase ?Outcome: Progressing ?  ?Problem: Education: ?Goal: Ability to state activities that reduce stress will improve ?Outcome: Progressing ?  ?Problem: Coping: ?Goal: Ability to identify and develop effective coping behavior will improve ?Outcome: Progressing ?  ?Problem: Self-Concept: ?Goal: Ability to identify factors that promote anxiety will improve ?Outcome: Progressing ?Goal: Level of anxiety will decrease ?Outcome: Progressing ?Goal: Ability to modify response to factors that promote anxiety will improve ?Outcome: Progressing ?  ?Problem: Education: ?Goal: Utilization of techniques to improve thought processes will improve ?Outcome: Progressing ?Goal: Knowledge of the prescribed therapeutic regimen will improve ?Outcome: Progressing ?  ?Problem:  Activity: ?Goal: Interest or engagement in leisure activities will improve ?Outcome: Progressing ?Goal: Imbalance in normal sleep/wake cycle will improve ?Outcome: Progressing ?  ?Problem: Coping: ?Goal: Coping ability will improve ?Outcome: Progressing ?Goal: Will verbalize feelings ?Outcome: Progressing ?  ?Problem: Health Behavior/Discharge Planning: ?Goal: Ability to make decisions will improve ?Outcome: Progressing ?Goal: Compliance with therapeutic regimen will improve ?Outcome: Progressing ?  ?Problem: Role Relationship: ?Goal: Will demonstrate positive changes in social behaviors and relationships ?Outcome: Progressing ?  ?Problem: Safety: ?Goal: Ability to disclose and discuss suicidal ideas will improve ?Outcome: Progressing ?Goal: Ability to identify and utilize support systems that promote safety will improve ?Outcome: Progressing ?  ?Problem: Self-Concept: ?Goal: Will verbalize positive feelings about self ?Outcome: Progressing ?Goal: Level of anxiety will decrease ?Outcome: Progressing ?  ?
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
## 2024-07-14 NOTE — Group Note (Signed)
 [facility] LCSW Group Therapy Note   Group Date: 07/14/2024 Start Time: 1300 End Time: 1440   Type of Therapy/Topic:  Group Therapy:  Emotion Regulation  Participation Level:  Active   Description of Group:    The purpose of this group is to assist patients in learning to regulate negative emotions and experience positive emotions. Patients will be guided to discuss ways in which they have been vulnerable to their negative emotions. These vulnerabilities will be juxtaposed with experiences of positive emotions or situations, and patients challenged to use positive emotions to combat negative ones. Special emphasis will be placed on coping with negative emotions in conflict situations, and patients will process healthy conflict resolution skills.  Therapeutic Goals: Patient will identify two positive emotions or experiences to reflect on in order to balance out negative emotions:  Patient will label two or more emotions that they find the most difficult to experience:  Patient will be able to demonstrate positive conflict resolution skills through discussion or role plays:   Summary of Patient Progress: Patient was present for the entirety of the group discussion. She was actively engaged in the discussion and contributed when she felt. Pt appeared to have slight insight into the topic and herself. She appeared open and receptive to feedback/comments from both her peers and the facilitator.   Therapeutic Modalities:   Cognitive Behavioral Therapy Feelings Identification Dialectical Behavioral Therapy   Nadara JONELLE Fam, LCSW

## 2024-07-14 NOTE — Progress Notes (Signed)
" °   07/14/24 1200  Psych Admission Type (Psych Patients Only)  Admission Status Involuntary  Psychosocial Assessment  Patient Complaints None (patient stated that I'm better today.)  Eye Contact Fair  Facial Expression Animated;Fixed smile  Affect Appropriate to circumstance  Speech Soft;Logical/coherent  Interaction Childlike;Assertive;Isolative (patient isolates to room to color when she wants/needs to be alone)  Motor Activity Slow  Appearance/Hygiene Layered clothes;Unremarkable  Behavior Characteristics Cooperative;Appropriate to situation  Mood Pleasant  Aggressive Behavior  Effect No apparent injury  Thought Process  Coherency WDL  Content WDL  Delusions None reported or observed  Perception WDL  Hallucination None reported or observed  Judgment WDL  Confusion None  Danger to Self  Current suicidal ideation? Denies  Self-Injurious Behavior No self-injurious ideation or behavior indicators observed or expressed   Agreement Not to Harm Self Yes  Description of Agreement Verbal  Danger to Others  Danger to Others None reported or observed   Patient's goal for today is being discharged and be good here on out, in which she will make good decisions, in order to achieve her goal. "

## 2024-07-14 NOTE — Group Note (Signed)
 Date:  07/14/2024 Time:  9:12 PM  Group Topic/Focus:  Wrap-Up Group:   The focus of this group is to help patients review their daily goal of treatment and discuss progress on daily workbooks.    Participation Level:  Active  Participation Quality:  Appropriate and Attentive  Affect:  Appropriate  Cognitive:  Alert and Appropriate  Insight: Appropriate and Good  Engagement in Group:  Engaged  Modes of Intervention:  Orientation  Additional Comments:     Amanda [name] 07/14/2024, 9:12 PM

## 2024-07-14 NOTE — Group Note (Signed)
 Date:  07/14/2024 Time:  10:18 AM  Group Topic/Focus:  Goals Group:   The focus of this group is to help patients establish daily goals to achieve during treatment and discuss how the patient can incorporate goal setting into their daily lives to aide in recovery.    Participation Level:  Active  Participation Quality:  Appropriate  Affect:  Appropriate  Cognitive:  Alert  Insight: Appropriate  Engagement in Group:  Engaged  Modes of Intervention:  Activity, Discussion, and Education  Additional Comments:    Skippy LITTIE Bennett 07/14/2024, 10:18 AM

## 2024-07-15 MED ORDER — LORATADINE 10 MG PO TABS
10.0000 mg | ORAL_TABLET | Freq: Every day | ORAL | Status: AC
  Administered 2024-07-15 – 2024-07-23 (×9): 10 mg via ORAL
  Filled 2024-07-15 (×9): qty 1

## 2024-07-15 NOTE — Group Note (Signed)
 Date:  07/15/2024 Time:  9:24 PM  Group Topic/Focus:  Wrap-Up Group:   The focus of this group is to help patients review their daily goal of treatment and discuss progress on daily workbooks.    Participation Level:  Active  Participation Quality:  Appropriate and Attentive  Affect:  Appropriate  Cognitive:  Alert  Insight: Appropriate  Engagement in Group:  Engaged  Modes of Intervention:  Discussion and Support  Additional Comments:    Ginny JONETTA Galeazzi 07/15/2024, 9:24 PM

## 2024-07-15 NOTE — Plan of Care (Signed)
 " Problem: Education: Goal: Knowledge of [facility] Education information/materials will improve 07/15/2024 0706 by Jannifer Tonna LABOR, RN Outcome: Progressing 07/15/2024 0706 by Jannifer Tonna LABOR, RN Outcome: Progressing Goal: Emotional status will improve 07/15/2024 0706 by Jannifer Tonna LABOR, RN Outcome: Progressing 07/15/2024 0706 by Jannifer Tonna LABOR, RN Outcome: Progressing Goal: Mental status will improve 07/15/2024 0706 by Jannifer Tonna LABOR, RN Outcome: Progressing 07/15/2024 0706 by Jannifer Tonna LABOR, RN Outcome: Progressing Goal: Verbalization of understanding the information provided will improve 07/15/2024 0706 by Jannifer Tonna LABOR, RN Outcome: Progressing 07/15/2024 0706 by Jannifer Tonna LABOR, RN Outcome: Progressing   Problem: Activity: Goal: Interest or engagement in activities will improve 07/15/2024 0706 by Jannifer Tonna LABOR, RN Outcome: Progressing 07/15/2024 0706 by Jannifer Tonna LABOR, RN Outcome: Progressing Goal: Sleeping patterns will improve 07/15/2024 0706 by Jannifer Tonna LABOR, RN Outcome: Progressing 07/15/2024 0706 by Jannifer Tonna LABOR, RN Outcome: Progressing   Problem: Coping: Goal: Ability to verbalize frustrations and anger appropriately will improve 07/15/2024 0706 by Jannifer Tonna LABOR, RN Outcome: Progressing 07/15/2024 0706 by Jannifer Tonna LABOR, RN Outcome: Progressing Goal: Ability to demonstrate self-control will improve 07/15/2024 0706 by Jannifer Tonna LABOR, RN Outcome: Progressing 07/15/2024 0706 by Jannifer Tonna LABOR, RN Outcome: Progressing   Problem: Health Behavior/Discharge Planning: Goal: Identification of resources available to assist in meeting health care needs will improve 07/15/2024 0706 by Jannifer Tonna LABOR, RN Outcome: Progressing 07/15/2024 0706 by Jannifer Tonna LABOR, RN Outcome: Progressing Goal: Compliance with treatment plan for underlying cause of condition will improve 07/15/2024 0706 by Jannifer Tonna LABOR, RN Outcome: Progressing 07/15/2024 0706 by Jannifer Tonna LABOR, RN Outcome: Progressing   Problem: Physical Regulation: Goal: Ability to maintain clinical measurements within normal limits will improve 07/15/2024 0706 by Jannifer Tonna LABOR, RN Outcome: Progressing 07/15/2024 0706 by Jannifer Tonna LABOR, RN Outcome: Progressing   Problem: Safety: Goal: Periods of time without injury will increase 07/15/2024 0706 by Jannifer Tonna LABOR, RN Outcome: Progressing 07/15/2024 0706 by Jannifer Tonna LABOR, RN Outcome: Progressing   Problem: Education: Goal: Ability to state activities that reduce stress will improve 07/15/2024 0706 by Jannifer Tonna LABOR, RN Outcome: Progressing 07/15/2024 0706 by Jannifer Tonna LABOR, RN Outcome: Progressing   Problem: Coping: Goal: Ability to identify and develop effective coping behavior will improve 07/15/2024 0706 by Jannifer Tonna LABOR, RN Outcome: Progressing 07/15/2024 0706 by Jannifer Tonna LABOR, RN Outcome: Progressing   Problem: Self-Concept: Goal: Ability to identify factors that promote anxiety will improve 07/15/2024 0706 by Jannifer Tonna LABOR, RN Outcome: Progressing 07/15/2024 0706 by Jannifer Tonna LABOR, RN Outcome: Progressing Goal: Level of anxiety will decrease 07/15/2024 0706 by Jannifer Tonna LABOR, RN Outcome: Progressing 07/15/2024 0706 by Jannifer Tonna LABOR, RN Outcome: Progressing Goal: Ability to modify response to factors that promote anxiety will improve 07/15/2024 0706 by Jannifer Tonna LABOR, RN Outcome: Progressing 07/15/2024 0706 by Jannifer Tonna LABOR, RN Outcome: Progressing   Problem: Education: Goal: Utilization of techniques to improve thought processes will improve 07/15/2024 0706 by Jannifer Tonna LABOR, RN Outcome: Progressing 07/15/2024 0706 by Jannifer Tonna LABOR, RN Outcome: Progressing Goal: Knowledge of the prescribed therapeutic regimen will improve 07/15/2024 0706 by Jannifer Tonna LABOR, RN Outcome: Progressing 07/15/2024 0706 by Jannifer Tonna LABOR, RN Outcome: Progressing   Problem: Activity: Goal: Interest or engagement in leisure  activities will improve 07/15/2024 0706 by Jannifer Tonna LABOR, RN Outcome: Progressing 07/15/2024 0706 by Jannifer Tonna LABOR, RN Outcome: Progressing Goal: Imbalance in normal sleep/wake cycle will improve 07/15/2024 0706 by  Jannifer Tonna LABOR, RN Outcome: Progressing 07/15/2024 0706 by Jannifer Tonna LABOR, RN Outcome: Progressing   Problem: Coping: Goal: Coping ability will improve 07/15/2024 0706 by Jannifer Tonna LABOR, RN Outcome: Progressing 07/15/2024 0706 by Jannifer Tonna LABOR, RN Outcome: Progressing Goal: Will verbalize feelings 07/15/2024 0706 by Jannifer Tonna LABOR, RN Outcome: Progressing 07/15/2024 0706 by Jannifer Tonna LABOR, RN Outcome: Progressing   Problem: Health Behavior/Discharge Planning: Goal: Ability to make decisions will improve 07/15/2024 0706 by Jannifer Tonna LABOR, RN Outcome: Progressing 07/15/2024 0706 by Jannifer Tonna LABOR, RN Outcome: Progressing Goal: Compliance with therapeutic regimen will improve 07/15/2024 0706 by Jannifer Tonna LABOR, RN Outcome: Progressing 07/15/2024 0706 by Jannifer Tonna LABOR, RN Outcome: Progressing   Problem: Role Relationship: Goal: Will demonstrate positive changes in social behaviors and relationships 07/15/2024 0706 by Jannifer Tonna LABOR, RN Outcome: Progressing 07/15/2024 0706 by Jannifer Tonna LABOR, RN Outcome: Progressing   Problem: Safety: Goal: Ability to disclose and discuss suicidal ideas will improve 07/15/2024 0706 by Jannifer Tonna LABOR, RN Outcome: Progressing 07/15/2024 0706 by Jannifer Tonna LABOR, RN Outcome: Progressing Goal: Ability to identify and utilize support systems that promote safety will improve 07/15/2024 0706 by Jannifer Tonna LABOR, RN Outcome: Progressing 07/15/2024 0706 by Jannifer Tonna LABOR, RN Outcome: Progressing   Problem: Self-Concept: Goal: Will verbalize positive feelings about self 07/15/2024 0706 by Jannifer Tonna LABOR, RN Outcome: Progressing 07/15/2024 0706 by Jannifer Tonna LABOR, RN Outcome: Progressing Goal: Level of anxiety will  decrease 07/15/2024 0706 by Jannifer Tonna LABOR, RN Outcome: Progressing 07/15/2024 0706 by Jannifer Tonna LABOR, RN Outcome: Progressing   "
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
## 2024-07-15 NOTE — Group Note (Signed)
 Date:  07/15/2024 Time:  12:56 PM  Group Topic/Focus:  Coping With Mental Health Crisis:   The purpose of this group is to help patients identify strategies for coping with mental health crisis.  Group discusses possible causes of crisis and ways to manage them effectively. MHT also lead a craft activity upon patient request.    Participation Level:  Did Not Attend  Leigh VEAR Pais 07/15/2024, 12:56 PM

## 2024-07-15 NOTE — Plan of Care (Signed)
?  Problem: Education: ?Goal: Knowledge of [facility] information/materials will improve ?Outcome: Progressing ?Goal: Emotional status will improve ?Outcome: Progressing ?Goal: Mental status will improve ?Outcome: Progressing ?Goal: Verbalization of understanding the information provided will improve ?Outcome: Progressing ?  ?Problem: Activity: ?Goal: Interest or engagement in activities will improve ?Outcome: Progressing ?Goal: Sleeping patterns will improve ?Outcome: Progressing ?  ?Problem: Coping: ?Goal: Ability to verbalize frustrations and anger appropriately will improve ?Outcome: Progressing ?Goal: Ability to demonstrate self-control will improve ?Outcome: Progressing ?  ?Problem: Health Behavior/Discharge Planning: ?Goal: Identification of resources available to assist in meeting health care needs will improve ?Outcome: Progressing ?Goal: Compliance with treatment plan for underlying cause of condition will improve ?Outcome: Progressing ?  ?Problem: Physical Regulation: ?Goal: Ability to maintain clinical measurements within normal limits will improve ?Outcome: Progressing ?  ?Problem: Safety: ?Goal: Periods of time without injury will increase ?Outcome: Progressing ?  ?Problem: Education: ?Goal: Ability to state activities that reduce stress will improve ?Outcome: Progressing ?  ?Problem: Coping: ?Goal: Ability to identify and develop effective coping behavior will improve ?Outcome: Progressing ?  ?Problem: Self-Concept: ?Goal: Ability to identify factors that promote anxiety will improve ?Outcome: Progressing ?Goal: Level of anxiety will decrease ?Outcome: Progressing ?Goal: Ability to modify response to factors that promote anxiety will improve ?Outcome: Progressing ?  ?Problem: Education: ?Goal: Utilization of techniques to improve thought processes will improve ?Outcome: Progressing ?Goal: Knowledge of the prescribed therapeutic regimen will improve ?Outcome: Progressing ?  ?Problem:  Activity: ?Goal: Interest or engagement in leisure activities will improve ?Outcome: Progressing ?Goal: Imbalance in normal sleep/wake cycle will improve ?Outcome: Progressing ?  ?Problem: Coping: ?Goal: Coping ability will improve ?Outcome: Progressing ?Goal: Will verbalize feelings ?Outcome: Progressing ?  ?Problem: Health Behavior/Discharge Planning: ?Goal: Ability to make decisions will improve ?Outcome: Progressing ?Goal: Compliance with therapeutic regimen will improve ?Outcome: Progressing ?  ?Problem: Role Relationship: ?Goal: Will demonstrate positive changes in social behaviors and relationships ?Outcome: Progressing ?  ?Problem: Safety: ?Goal: Ability to disclose and discuss suicidal ideas will improve ?Outcome: Progressing ?Goal: Ability to identify and utilize support systems that promote safety will improve ?Outcome: Progressing ?  ?Problem: Self-Concept: ?Goal: Will verbalize positive feelings about self ?Outcome: Progressing ?Goal: Level of anxiety will decrease ?Outcome: Progressing ?  ?

## 2024-07-15 NOTE — Group Note (Signed)
 Date:  07/15/2024 Time:  9:41 AM  Group Topic/Focus:  Activity Group: The focus of the group is to encourage the patients to go outside to the courtyard and get some fresh air and some exercise.   Participation Level:  Did Not Attend   Amanda [name] 07/15/2024, 9:41 AM

## 2024-07-15 NOTE — Group Note (Signed)
 Recreation Therapy Group Note   Group Topic:Stress Management  Group Date: 07/15/2024 Start Time: 1530 End Time: 1545 Facilitators: Celestia Jeoffrey BRAVO, LRT, CTRS Location: Dayroom  Group Description: PMR (Progressive Muscle Relaxation). LRT educates patients on what PMR is and the benefits that come from it. Patients are asked to sit with their feet flat on the floor while sitting up and all the way back in their chair, if possible. LRT and pts follow a prompt through a speaker that requires you to tense and release different muscles in their body and focus on their breathing. During session, lights are off and soft music is being played. Pts are given a stress ball to use if needed.   Goal Area(s) Addressed:  Patients will be able to describe progressive muscle relaxation.  Patient will practice using relaxation technique. Patient will identify a new coping skill.  Patient will follow multistep directions to reduce anxiety and stress.   Affect/Mood: N/A   Participation Level: Did not attend    Clinical Observations/Individualized Feedback: Patient did not attend.  Plan: Continue to engage patient in RT group sessions 2-3x/week.   Jeoffrey BRAVO Celestia, LRT, CTRS 07/15/2024 4:41 PM

## 2024-07-15 NOTE — Group Note (Signed)
 Recreation Therapy Group Note   Group Topic:Healthy Support Systems  Group Date: 07/15/2024 Start Time: 1000 End Time: 1040 Facilitators: Celestia Jeoffrey BRAVO, LRT, CTRS Location: Craft Room  Group Description: Straw Bridge. In groups or individually, patients were given 10 plastic drinking straws and an equal length of masking tape. Using the materials provided, patients were instructed to build a free-standing bridge-like structure to suspend an everyday item (ex: deck of cards) off the floor or table surface. All materials were required to be used in secondary school teacher. LRT facilitated post-activity discussion reviewing the importance of having strong and healthy support systems in our lives. LRT discussed how the people in our lives serve as the tape and the deck of cards we placed on top of our straw structure are the stressors we face in daily life. LRT and pts discussed what happens in our life when things get too heavy for us , and we don't have strong supports outside of the hospital. Pt shared 2 of their healthy supports in their life aloud in the group.   Goal Area(s) Addressed:  Patient will identify 2 healthy supports in their life. Patient will identify skills to successfully complete activity. Patient will identify correlation of this activity to life post-discharge.  Patient will build on frustration tolerance skills. Patient will increase team building and communication skills.    Affect/Mood: N/A   Participation Level: Did not attend    Clinical Observations/Individualized Feedback: Patient did not attend.  Plan: Continue to engage patient in RT group sessions 2-3x/week.   Jeoffrey BRAVO Celestia, LRT, CTRS 07/15/2024 10:59 AM

## 2024-07-15 NOTE — Group Note (Signed)
"                                                 [facility] LCSW Group Therapy Note    Group Date: 07/15/2024 Start Time: 1300 End Time: 1400  Type of Therapy and Topic:  Group Therapy:  Overcoming Obstacles  Participation Level:  [facility] PARTICIPATION LEVEL: Active  Mood:  Description of Group:   In this group patients will be encouraged to explore what they see as obstacles to their own wellness and recovery. They will be guided to discuss their thoughts, feelings, and behaviors related to these obstacles. The group will process together ways to cope with barriers, with attention given to specific choices patients can make. Each patient will be challenged to identify changes they are motivated to make in order to overcome their obstacles. This group will be process-oriented, with patients participating in exploration of their own experiences as well as giving and receiving support and challenge from other group members.  Therapeutic Goals: 1. Patient will identify personal and current obstacles as they relate to admission. 2. Patient will identify barriers that currently interfere with their wellness or overcoming obstacles.  3. Patient will identify feelings, thought process and behaviors related to these barriers. 4. Patient will identify two changes they are willing to make to overcome these obstacles:    Summary of Patient Progress Patient was present in group.  Patient was supportive of other group members.  Patient was engaged and appropriate. Insight was poor.    Therapeutic Modalities:   Cognitive Behavioral Therapy Solution Focused Therapy Motivational Interviewing Relapse Prevention Therapy   [name], LCSW "

## 2024-07-15 NOTE — Progress Notes (Addendum)
" °   07/15/24 1400  Psych Admission Type (Psych Patients Only)  Admission Status Involuntary  Psychosocial Assessment  Patient Complaints Sleep disturbance (patient stated that congestion kept her from sleeping well)  Eye Contact Fair  Facial Expression Fixed smile  Affect Appropriate to circumstance  Speech Soft;Logical/coherent  Interaction Childlike;Assertive;Isolative (patient comes out for meals and medication isolates to room to color when she wants/needs to be alone)  Motor Activity Slow  Appearance/Hygiene Layered clothes;Unremarkable  Behavior Characteristics Cooperative;Appropriate to situation  Mood Pleasant  Aggressive Behavior  Effect No apparent injury  Thought Process  Coherency WDL  Content WDL  Delusions None reported or observed  Perception WDL  Hallucination None reported or observed  Judgment WDL  Confusion None  Danger to Self  Current suicidal ideation? Denies  Self-Injurious Behavior No self-injurious ideation or behavior indicators observed or expressed   Agreement Not to Harm Self Yes  Description of Agreement Verbal  Danger to Others  Danger to Others None reported or observed   Patient's goal for today, per her self-inventory is to work towards discharge, in which groups/socialize will help her achieve her goal. Patient also stated that she was feeling a little isolative and thinking a lot and what I'm going to do when I get out of here. My Dortha is coming up and I've been writing down what I want for my Birthday. "
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
## 2024-07-16 NOTE — Plan of Care (Signed)
?  Problem: Education: ?Goal: Knowledge of [facility] information/materials will improve ?Outcome: Progressing ?Goal: Emotional status will improve ?Outcome: Progressing ?Goal: Mental status will improve ?Outcome: Progressing ?Goal: Verbalization of understanding the information provided will improve ?Outcome: Progressing ?  ?Problem: Activity: ?Goal: Interest or engagement in activities will improve ?Outcome: Progressing ?Goal: Sleeping patterns will improve ?Outcome: Progressing ?  ?Problem: Coping: ?Goal: Ability to verbalize frustrations and anger appropriately will improve ?Outcome: Progressing ?Goal: Ability to demonstrate self-control will improve ?Outcome: Progressing ?  ?Problem: Health Behavior/Discharge Planning: ?Goal: Identification of resources available to assist in meeting health care needs will improve ?Outcome: Progressing ?Goal: Compliance with treatment plan for underlying cause of condition will improve ?Outcome: Progressing ?  ?Problem: Physical Regulation: ?Goal: Ability to maintain clinical measurements within normal limits will improve ?Outcome: Progressing ?  ?Problem: Safety: ?Goal: Periods of time without injury will increase ?Outcome: Progressing ?  ?Problem: Education: ?Goal: Ability to state activities that reduce stress will improve ?Outcome: Progressing ?  ?Problem: Coping: ?Goal: Ability to identify and develop effective coping behavior will improve ?Outcome: Progressing ?  ?Problem: Self-Concept: ?Goal: Ability to identify factors that promote anxiety will improve ?Outcome: Progressing ?Goal: Level of anxiety will decrease ?Outcome: Progressing ?Goal: Ability to modify response to factors that promote anxiety will improve ?Outcome: Progressing ?  ?Problem: Education: ?Goal: Utilization of techniques to improve thought processes will improve ?Outcome: Progressing ?Goal: Knowledge of the prescribed therapeutic regimen will improve ?Outcome: Progressing ?  ?Problem:  Activity: ?Goal: Interest or engagement in leisure activities will improve ?Outcome: Progressing ?Goal: Imbalance in normal sleep/wake cycle will improve ?Outcome: Progressing ?  ?Problem: Coping: ?Goal: Coping ability will improve ?Outcome: Progressing ?Goal: Will verbalize feelings ?Outcome: Progressing ?  ?Problem: Health Behavior/Discharge Planning: ?Goal: Ability to make decisions will improve ?Outcome: Progressing ?Goal: Compliance with therapeutic regimen will improve ?Outcome: Progressing ?  ?Problem: Role Relationship: ?Goal: Will demonstrate positive changes in social behaviors and relationships ?Outcome: Progressing ?  ?Problem: Safety: ?Goal: Ability to disclose and discuss suicidal ideas will improve ?Outcome: Progressing ?Goal: Ability to identify and utilize support systems that promote safety will improve ?Outcome: Progressing ?  ?Problem: Self-Concept: ?Goal: Will verbalize positive feelings about self ?Outcome: Progressing ?Goal: Level of anxiety will decrease ?Outcome: Progressing ?  ?

## 2024-07-16 NOTE — Group Note (Signed)
 Recreation Therapy Group Note   Group Topic:Relaxation  Group Date: 07/16/2024 Start Time: 1040 End Time: 1120 Facilitators: Celestia Jeoffrey BRAVO, LRT, CTRS Location: Dayroom  Group Description: Meditation. LRT and patients discussed what they know about meditation and mindfulness. LRT played a Deep Breathing Meditation exercise script for patients to follow along to. LRT and patients discussed how meditation and deep breathing can be used as a coping skill post--discharge to help manage symptoms of stress.   Goal Area(s) Addressed: Patient will practice using relaxation technique. Patient will identify a new coping skill.  Patient will follow multistep directions to reduce anxiety and stress.   Affect/Mood: N/A   Participation Level: Did not attend    Clinical Observations/Individualized Feedback: Patient did not attend.  Plan: Continue to engage patient in RT group sessions 2-3x/week.   Jeoffrey BRAVO Celestia, LRT, CTRS 07/16/2024 11:47 AM

## 2024-07-16 NOTE — Group Note (Signed)
 Date:  07/16/2024 Time:  10:12 PM  Group Topic/Focus:  Wrap-Up Group:   The focus of this group is to help patients review their daily goal of treatment and discuss progress on daily workbooks.    Participation Level:  Active  Participation Quality:  Appropriate, Attentive, Sharing, and Supportive  Affect:  Appropriate  Cognitive:  Appropriate  Insight: Appropriate and Good  Engagement in Group:  Engaged  Modes of Intervention:  Discussion  Additional Comments:     Kerri Katz 07/16/2024, 10:12 PM

## 2024-07-16 NOTE — Progress Notes (Signed)
" °   07/15/24 2200  Psych Admission Type (Psych Patients Only)  Admission Status Involuntary  Psychosocial Assessment  Patient Complaints Sleep disturbance  Eye Contact Fair  Facial Expression Fixed smile  Affect Appropriate to circumstance  Speech Logical/coherent;Soft  Interaction Assertive;Childlike  Motor Activity Slow  Appearance/Hygiene Layered clothes;Unremarkable  Behavior Characteristics Cooperative;Appropriate to situation  Mood Pleasant  Aggressive Behavior  Effect No apparent injury  Thought Process  Coherency WDL  Content WDL  Delusions None reported or observed  Perception WDL  Hallucination None reported or observed  Judgment WDL  Confusion None  Danger to Self  Current suicidal ideation? Denies  Self-Injurious Behavior No self-injurious ideation or behavior indicators observed or expressed   Danger to Others  Danger to Others None reported or observed    "

## 2024-07-16 NOTE — Progress Notes (Signed)
" °   07/16/24 1300  Psych Admission Type (Psych Patients Only)  Admission Status Involuntary  Psychosocial Assessment  Patient Complaints None  Eye Contact Fair  Facial Expression Fixed smile  Affect Appropriate to circumstance  Speech Logical/coherent  Interaction Assertive  Motor Activity Slow  Appearance/Hygiene Layered clothes  Behavior Characteristics Cooperative  Mood Pleasant  Thought Process  Coherency WDL  Content WDL  Delusions None reported or observed  Perception WDL  Hallucination None reported or observed  Judgment WDL  Confusion None  Danger to Self  Current suicidal ideation? Denies  Agreement Not to Harm Self Yes  Description of Agreement verbal  Danger to Others  Danger to Others None reported or observed    "

## 2024-07-16 NOTE — [facility] Group Notes (Signed)
 Spirituality Group   Group Goal: Support / Education around grief and loss   Secondary Goal: Self compassion and strength awareness  Group Description: Following introductions and group rules, group members engaged in facilitated group dialog and support around topic of loss, with particular support around experiences of loss in their lives. Group members identified types of loss (relationships / self / things) as well as patterns, circumstances, and changes that precipitate loss. Reflection invited on thoughts / feelings around loss, normalized grief responses, and recognized variety in grief experience. Group noted Worden's four tasks of grief in discussion. Group drew on Adlerian / Rogerian, narrative, MI, with [place] group therapy as a primary framework.   Observations: Did not attend  Wynton Hufstetler L. Delores HERO.Div

## 2024-07-16 NOTE — Group Note (Signed)
 Recreation Therapy Group Note   Group Topic:Leisure Education  Group Date: 07/16/2024 Start Time: 1530 End Time: 1620 Facilitators: Celestia Jeoffrey FORBES ARTICE, CTRS Location: Craft Room  Group Description: Leisure. Patients were given the option to choose from journaling, coloring, drawing, making origami, playing with playdoh, listening to music or singing karaoke. LRT and pts discussed the meaning of leisure, the importance of participating in leisure during their free time/when they're outside of the hospital, as well as how our leisure interests can also serve as coping skills.   Goal Area(s) Addressed:  Patient will identify a current leisure interest.  Patient will learn the definition of leisure. Patient will practice making a positive decision. Patient will have the opportunity to try a new leisure activity. Patient will communicate with peers and LRT.    Affect/Mood: Appropriate   Participation Level: Active and Engaged   Participation Quality: Independent   Behavior: Calm and Cooperative   Speech/Thought Process: Coherent   Insight: Fair   Judgement: Fair    Modes of Intervention: Art, Activity, Clarification, Education, Exploration, and Music   Patient Response to Interventions:  Attentive, Interested , and Receptive   Education Outcome:  Acknowledges education   Clinical Observations/Individualized Feedback: Allysson was active in their participation of session activities and group discussion. Pt identified write and draw as things she does in her free time.    Plan: Continue to engage patient in RT group sessions 2-3x/week.   Jeoffrey FORBES Celestia, LRT, CTRS 07/16/2024 5:10 PM

## 2024-07-16 NOTE — Plan of Care (Signed)
   Problem: Education: Goal: Emotional status will improve Outcome: Progressing Goal: Mental status will improve Outcome: Progressing Goal: Verbalization of understanding the information provided will improve Outcome: Progressing

## 2024-07-17 NOTE — Group Note (Unsigned)
 Date:  07/18/2024 Time:  2:06 AM  Group Topic/Focus:  Recovery Goals:   The focus of this group is to identify appropriate goals for recovery and establish a plan to achieve them. Wrap-Up Group:   The focus of this group is to help patients review their daily goal of treatment and discuss progress on daily workbooks.    Participation Level:  Active  Participation Quality:  Appropriate and Attentive  Affect:  Appropriate  Cognitive:  Alert, Appropriate, and Oriented  Insight: Appropriate and Good  Engagement in Group:  Engaged  Modes of Intervention:  Discussion and Support  Additional Comments:  N/A  Butler LITTIE Gelineau 07/18/2024, 2:06 AM

## 2024-07-17 NOTE — Plan of Care (Signed)
  Problem: Education: Goal: Knowledge of [facility] Education information/materials will improve Outcome: Progressing   Problem: Education: Goal: Emotional status will improve Outcome: Progressing   

## 2024-07-17 NOTE — Progress Notes (Signed)
 Patient has been asleep since the beginning of this writer's shift. This writer will administer scheduled medication once patient gets up. NP was notified face-to-face.
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
## 2024-07-17 NOTE — Group Note (Signed)
 Date:  07/17/2024 Time:  1:45 PM  Group Topic/Focus:  Stages of Change:   The focus of this group is to explain the stages of change and help patients identify changes they want to make upon discharge.    Participation Level:  Did Not Attend   Camellia HERO Macrina Lehnert 07/17/2024, 1:45 PM

## 2024-07-17 NOTE — Plan of Care (Signed)
?  Problem: Education: ?Goal: Knowledge of [facility] information/materials will improve ?Outcome: Progressing ?Goal: Emotional status will improve ?Outcome: Progressing ?Goal: Mental status will improve ?Outcome: Progressing ?Goal: Verbalization of understanding the information provided will improve ?Outcome: Progressing ?  ?Problem: Activity: ?Goal: Interest or engagement in activities will improve ?Outcome: Progressing ?Goal: Sleeping patterns will improve ?Outcome: Progressing ?  ?Problem: Coping: ?Goal: Ability to verbalize frustrations and anger appropriately will improve ?Outcome: Progressing ?Goal: Ability to demonstrate self-control will improve ?Outcome: Progressing ?  ?Problem: Health Behavior/Discharge Planning: ?Goal: Identification of resources available to assist in meeting health care needs will improve ?Outcome: Progressing ?Goal: Compliance with treatment plan for underlying cause of condition will improve ?Outcome: Progressing ?  ?Problem: Physical Regulation: ?Goal: Ability to maintain clinical measurements within normal limits will improve ?Outcome: Progressing ?  ?Problem: Safety: ?Goal: Periods of time without injury will increase ?Outcome: Progressing ?  ?Problem: Education: ?Goal: Ability to state activities that reduce stress will improve ?Outcome: Progressing ?  ?Problem: Coping: ?Goal: Ability to identify and develop effective coping behavior will improve ?Outcome: Progressing ?  ?Problem: Self-Concept: ?Goal: Ability to identify factors that promote anxiety will improve ?Outcome: Progressing ?Goal: Level of anxiety will decrease ?Outcome: Progressing ?Goal: Ability to modify response to factors that promote anxiety will improve ?Outcome: Progressing ?  ?Problem: Education: ?Goal: Utilization of techniques to improve thought processes will improve ?Outcome: Progressing ?Goal: Knowledge of the prescribed therapeutic regimen will improve ?Outcome: Progressing ?  ?Problem:  Activity: ?Goal: Interest or engagement in leisure activities will improve ?Outcome: Progressing ?Goal: Imbalance in normal sleep/wake cycle will improve ?Outcome: Progressing ?  ?Problem: Coping: ?Goal: Coping ability will improve ?Outcome: Progressing ?Goal: Will verbalize feelings ?Outcome: Progressing ?  ?Problem: Health Behavior/Discharge Planning: ?Goal: Ability to make decisions will improve ?Outcome: Progressing ?Goal: Compliance with therapeutic regimen will improve ?Outcome: Progressing ?  ?Problem: Role Relationship: ?Goal: Will demonstrate positive changes in social behaviors and relationships ?Outcome: Progressing ?  ?Problem: Safety: ?Goal: Ability to disclose and discuss suicidal ideas will improve ?Outcome: Progressing ?Goal: Ability to identify and utilize support systems that promote safety will improve ?Outcome: Progressing ?  ?Problem: Self-Concept: ?Goal: Will verbalize positive feelings about self ?Outcome: Progressing ?Goal: Level of anxiety will decrease ?Outcome: Progressing ?  ?

## 2024-07-17 NOTE — Progress Notes (Signed)
" °   07/16/24 2000  Psych Admission Type (Psych Patients Only)  Admission Status Involuntary  Psychosocial Assessment  Patient Complaints None  Eye Contact Fair  Facial Expression Flat  Affect Anxious  Speech Logical/coherent  Interaction Assertive  Motor Activity Slow  Appearance/Hygiene Layered clothes  Behavior Characteristics Cooperative  Mood Pleasant  Thought Process  Coherency WDL  Content WDL  Delusions None reported or observed  Perception WDL  Hallucination None reported or observed  Judgment WDL  Confusion None  Danger to Self  Current suicidal ideation? Denies  Agreement Not to Harm Self Yes  Description of Agreement verbal  Danger to Others  Danger to Others None reported or observed   No distress noted, she denies SI/HI/AVH interacting appropriately with peer and staff, thoughts are organized and coherent, affect is congruent with mood, 15 minutes safety checks maintained.  "

## 2024-07-17 NOTE — Progress Notes (Signed)
" °   07/17/24 1300  Psych Admission Type (Psych Patients Only)  Admission Status Involuntary  Psychosocial Assessment  Patient Complaints None  Eye Contact Fair;Watchful  Facial Expression Anxious  Affect Appropriate to circumstance  Speech Soft;Logical/coherent  Interaction Childlike;Assertive;Isolative (patient comes out for meals and medication, tends to isolate to self/room to color when she wants to be alone)  Motor Activity Slow  Appearance/Hygiene Layered clothes;Unremarkable  Behavior Characteristics Cooperative;Appropriate to situation  Mood Pleasant;Elated (patient is extremely excited about her avatar drawings and the different versions she's coming up with)  Aggressive Behavior  Effect No apparent injury  Thought Process  Coherency WDL  Content WDL  Delusions None reported or observed  Perception WDL  Hallucination None reported or observed  Judgment WDL  Confusion WDL  Danger to Self  Current suicidal ideation? Denies  Self-Injurious Behavior No self-injurious ideation or behavior indicators observed or expressed   Agreement Not to Harm Self Yes  Description of Agreement Verbal  Danger to Others  Danger to Others None reported or observed   Patient's goal for today, per her self-inventory is my discharge, in which she will stay in my room, read, write, draw in order to achieve her goal. "

## 2024-07-17 NOTE — Group Note (Signed)
 Date:  07/17/2024 Time:  10:34 PM  Group Topic/Focus:  Overcoming Stress:   The focus of this group is to define stress and help patients assess their triggers.    Participation Level:  Active  Participation Quality:  Appropriate  Affect:  Appropriate  Cognitive:  Appropriate  Insight: Appropriate  Engagement in Group:  Engaged  Modes of Intervention:  Activity  Additional Comments:    Camellia HERO Luana Tatro 07/17/2024, 10:34 PM

## 2024-07-18 NOTE — Progress Notes (Signed)
" °   07/18/24 1500  Psych Admission Type (Psych Patients Only)  Admission Status Involuntary  Psychosocial Assessment  Patient Complaints None  Eye Contact Fair;Watchful  Facial Expression Animated;Fixed smile  Affect Appropriate to circumstance  Speech Soft;Logical/coherent  Interaction Childlike;Assertive  Motor Activity Slow  Appearance/Hygiene Layered clothes;Unremarkable  Behavior Characteristics Cooperative;Appropriate to situation  Mood Elated;Pleasant (patient continues to be extremely excited about her drawings as well as her sleep journal, documenting her dreams)  Aggressive Behavior  Effect No apparent injury  Thought Process  Coherency WDL  Content WDL  Delusions None reported or observed  Perception WDL  Hallucination None reported or observed  Judgment WDL  Confusion WDL  Danger to Self  Current suicidal ideation? Denies  Self-Injurious Behavior No self-injurious ideation or behavior indicators observed or expressed   Agreement Not to Harm Self Yes  Description of Agreement Verbal  Danger to Others  Danger to Others None reported or observed    "

## 2024-07-18 NOTE — Progress Notes (Signed)
" °   07/17/24 2000  Psych Admission Type (Psych Patients Only)  Admission Status Involuntary  Psychosocial Assessment  Patient Complaints None  Eye Contact Fair  Facial Expression Flat  Affect Anxious  Speech Logical/coherent  Interaction Assertive  Motor Activity Slow  Appearance/Hygiene Layered clothes  Behavior Characteristics Cooperative;Appropriate to situation  Mood Pleasant;Euphoric  Thought Process  Coherency WDL  Content WDL  Delusions None reported or observed  Perception WDL  Hallucination None reported or observed  Judgment WDL  Confusion None  Danger to Self  Current suicidal ideation? Denies  Agreement Not to Harm Self Yes  Description of Agreement verbal  Danger to Others  Danger to Others None reported or observed   Patient alert and oriented x 4, affect is flat but brightens upon approach he denies SI/HI/AVH, thoughts are organized and coherent, 15 minutes safety checks maintained  "

## 2024-07-18 NOTE — Plan of Care (Signed)
   Problem: Education: Goal: Knowledge of [facility] Education information/materials will improve Outcome: Progressing   Problem: Education: Goal: Emotional status will improve Outcome: Progressing   Problem: Education: Goal: Mental status will improve Outcome: Progressing   Problem: Education: Goal: Verbalization of understanding the information provided will improve Outcome: Progressing

## 2024-07-18 NOTE — Plan of Care (Signed)
?  Problem: Education: ?Goal: Knowledge of [facility] information/materials will improve ?Outcome: Progressing ?Goal: Emotional status will improve ?Outcome: Progressing ?Goal: Mental status will improve ?Outcome: Progressing ?Goal: Verbalization of understanding the information provided will improve ?Outcome: Progressing ?  ?Problem: Activity: ?Goal: Interest or engagement in activities will improve ?Outcome: Progressing ?Goal: Sleeping patterns will improve ?Outcome: Progressing ?  ?Problem: Coping: ?Goal: Ability to verbalize frustrations and anger appropriately will improve ?Outcome: Progressing ?Goal: Ability to demonstrate self-control will improve ?Outcome: Progressing ?  ?Problem: Health Behavior/Discharge Planning: ?Goal: Identification of resources available to assist in meeting health care needs will improve ?Outcome: Progressing ?Goal: Compliance with treatment plan for underlying cause of condition will improve ?Outcome: Progressing ?  ?Problem: Physical Regulation: ?Goal: Ability to maintain clinical measurements within normal limits will improve ?Outcome: Progressing ?  ?Problem: Safety: ?Goal: Periods of time without injury will increase ?Outcome: Progressing ?  ?Problem: Education: ?Goal: Ability to state activities that reduce stress will improve ?Outcome: Progressing ?  ?Problem: Coping: ?Goal: Ability to identify and develop effective coping behavior will improve ?Outcome: Progressing ?  ?Problem: Self-Concept: ?Goal: Ability to identify factors that promote anxiety will improve ?Outcome: Progressing ?Goal: Level of anxiety will decrease ?Outcome: Progressing ?Goal: Ability to modify response to factors that promote anxiety will improve ?Outcome: Progressing ?  ?Problem: Education: ?Goal: Utilization of techniques to improve thought processes will improve ?Outcome: Progressing ?Goal: Knowledge of the prescribed therapeutic regimen will improve ?Outcome: Progressing ?  ?Problem:  Activity: ?Goal: Interest or engagement in leisure activities will improve ?Outcome: Progressing ?Goal: Imbalance in normal sleep/wake cycle will improve ?Outcome: Progressing ?  ?Problem: Coping: ?Goal: Coping ability will improve ?Outcome: Progressing ?Goal: Will verbalize feelings ?Outcome: Progressing ?  ?Problem: Health Behavior/Discharge Planning: ?Goal: Ability to make decisions will improve ?Outcome: Progressing ?Goal: Compliance with therapeutic regimen will improve ?Outcome: Progressing ?  ?Problem: Role Relationship: ?Goal: Will demonstrate positive changes in social behaviors and relationships ?Outcome: Progressing ?  ?Problem: Safety: ?Goal: Ability to disclose and discuss suicidal ideas will improve ?Outcome: Progressing ?Goal: Ability to identify and utilize support systems that promote safety will improve ?Outcome: Progressing ?  ?Problem: Self-Concept: ?Goal: Will verbalize positive feelings about self ?Outcome: Progressing ?Goal: Level of anxiety will decrease ?Outcome: Progressing ?  ?

## 2024-07-18 NOTE — Progress Notes (Signed)
 Patient has been asleep since the beginning of this writer's shift. This writer will administer scheduled medication once patient gets up. NP will be notified face-to-face.

## 2024-07-18 NOTE — Group Note (Signed)
 Date:  07/18/2024 Time:  7:18 PM  Group Topic/Focus:  Coping With Mental Health Crisis:   The purpose of this group is to help patients identify strategies for coping with mental health crisis.  Group discusses possible causes of crisis and ways to manage them effectively. Healthy Communication:   The focus of this group is to discuss communication, barriers to communication, as well as healthy ways to communicate with others.  A group session was conducted with patients utilizing the activity Two Truths and One Lie to promote peer interaction and allow both patients and facilitator to become more familiar with one another. Following this activity, patients participated in an arts and crafts intervention involving the creation of viral nursing glove dolls. Patients demonstrated enjoyment and engagement throughout the activity. This intervention provided an opportunity for patients to use a simple and accessible coping mechanism to redirect focus and promote mindfulness. Materials used were minimal, including one glove, a mask, and a marker. Patients appeared excited during the process and expressed a sense of accomplishment upon completing the activity.  Participation Level:  Did Not Attend  Participation Quality:    Affect:    Cognitive:    Insight:   Engagement in Group:    Modes of Intervention:    Additional Comments:    [name] L Lira Stephen 07/18/2024, 7:18 PM

## 2024-07-18 NOTE — Group Note (Signed)
 Date:  07/18/2024 Time:  9:05 PM    Additional Comments:  Did not attend group.  Amanda [name] 07/18/2024, 9:05 PM

## 2024-07-18 NOTE — BH IP Treatment Plan (Signed)
 Interdisciplinary Treatment and Diagnostic Plan Update  07/18/2024 Time of Session: 12:45 pm Amanda [name] MRN: [id number]  Principal Diagnosis: MDD (major depressive disorder), recurrent episode, severe (HCC)  Secondary Diagnoses: Principal Problem:   MDD (major depressive disorder), recurrent episode, severe (HCC)   Current Medications:  Current Facility-Administered Medications  Medication Dose Route Frequency Provider Last Rate Last Admin   acetaminophen  (TYLENOL ) tablet 650 mg  650 mg Oral Q6H PRN Tex Drilling, NP   650 mg at 07/14/24 0928   alum & mag hydroxide-simeth (MAALOX/MYLANTA) 200-200-20 MG/5ML suspension 30 mL  30 mL Oral Q4H PRN Tex Drilling, NP       haloperidol  (HALDOL ) tablet 5 mg  5 mg Oral TID PRN Tex Drilling, NP       And   diphenhydrAMINE  (BENADRYL ) capsule 50 mg  50 mg Oral TID PRN Tex Drilling, NP       haloperidol  lactate (HALDOL ) injection 5 mg  5 mg Intramuscular TID PRN Tex Drilling, NP       And   diphenhydrAMINE  (BENADRYL ) injection 50 mg  50 mg Intramuscular TID PRN Tex Drilling, NP       And   LORazepam  (ATIVAN ) injection 2 mg  2 mg Intramuscular TID PRN Tex Drilling, NP       haloperidol  lactate (HALDOL ) injection 10 mg  10 mg Intramuscular TID PRN Tex Drilling, NP       And   diphenhydrAMINE  (BENADRYL ) injection 50 mg  50 mg Intramuscular TID PRN Tex Drilling, NP       And   LORazepam  (ATIVAN ) injection 2 mg  2 mg Intramuscular TID PRN Tex Drilling, NP       escitalopram  (LEXAPRO ) tablet 10 mg  10 mg Oral Daily May, Tanya, NP   10 mg at 07/17/24 1232   hydrOXYzine  (ATARAX ) tablet 25 mg  25 mg Oral TID PRN Tex Drilling, NP   25 mg at 07/06/24 2125   lamoTRIgine  (LAMICTAL ) tablet 50 mg  50 mg Oral Daily May, Tanya, NP   50 mg at 07/17/24 1232   loratadine  (CLARITIN ) tablet 10 mg  10 mg Oral Daily May, Tanya, NP   10 mg at 07/17/24 1232   magnesium  hydroxide (MILK OF MAGNESIA) suspension 30 mL  30 mL Oral Daily PRN  Tex Drilling, NP       nicotine  (NICODERM CQ  - dosed in mg/24 hours) patch 21 mg  21 mg Transdermal Daily Jadapalle, Sree, MD   21 mg at 07/17/24 1233   traZODone  (DESYREL ) tablet 25 mg  25 mg Oral QHS PRN May, Tanya, NP   25 mg at 07/14/24 2111   PTA Medications: Medications Prior to Admission  Medication Sig Dispense Refill Last Dose/Taking   busPIRone (BUSPAR) 5 MG tablet Take 5 mg by mouth 3 (three) times daily.      lithium  carbonate 300 MG capsule Take 300 mg by mouth daily.      naproxen sodium (ALEVE) 220 MG tablet Take 440 mg by mouth 2 (two) times daily as needed (For pain).       Patient Stressors:    Patient Strengths:    Treatment Modalities: Medication Management, Group therapy, Case management,  1 to 1 session with clinician, Psychoeducation, Recreational therapy.   Physician Treatment Plan for Primary Diagnosis: MDD (major depressive disorder), recurrent episode, severe (HCC) Long Term Goal(s): Improvement in symptoms so as ready for discharge   Short Term Goals: Ability to identify changes in lifestyle to reduce recurrence of condition will  improve Ability to verbalize feelings will improve Ability to disclose and discuss suicidal ideas Ability to identify and develop effective coping behaviors will improve Ability to maintain clinical measurements within normal limits will improve Ability to identify triggers associated with substance abuse/mental health issues will improve  Medication Management: Evaluate patient's response, side effects, and tolerance of medication regimen.  Therapeutic Interventions: 1 to 1 sessions, Unit Group sessions and Medication administration.  Evaluation of Outcomes: Progressing  Physician Treatment Plan for Secondary Diagnosis: Principal Problem:   MDD (major depressive disorder), recurrent episode, severe (HCC)  Long Term Goal(s): Improvement in symptoms so as ready for discharge   Short Term Goals: Ability to identify changes  in lifestyle to reduce recurrence of condition will improve Ability to verbalize feelings will improve Ability to disclose and discuss suicidal ideas Ability to identify and develop effective coping behaviors will improve Ability to maintain clinical measurements within normal limits will improve Ability to identify triggers associated with substance abuse/mental health issues will improve     Medication Management: Evaluate patient's response, side effects, and tolerance of medication regimen.  Therapeutic Interventions: 1 to 1 sessions, Unit Group sessions and Medication administration.  Evaluation of Outcomes: Progressing   RN Treatment Plan for Primary Diagnosis: MDD (major depressive disorder), recurrent episode, severe (HCC) Long Term Goal(s): Knowledge of disease and therapeutic regimen to maintain health will improve  Short Term Goals: Ability to remain free from injury will improve, Ability to verbalize frustration and anger appropriately will improve, Ability to demonstrate self-control, Ability to participate in decision making will improve, Ability to verbalize feelings will improve, Ability to disclose and discuss suicidal ideas, and Ability to identify and develop effective coping behaviors will improve  Medication Management: RN will administer medications as ordered by provider, will assess and evaluate patient's response and provide education to patient for prescribed medication. RN will report any adverse and/or side effects to prescribing provider.  Therapeutic Interventions: 1 on 1 counseling sessions, Psychoeducation, Medication administration, Evaluate responses to treatment, Monitor vital signs and CBGs as ordered, Perform/monitor CIWA, COWS, AIMS and Fall Risk screenings as ordered, Perform wound care treatments as ordered.  Evaluation of Outcomes: Progressing   LCSW Treatment Plan for Primary Diagnosis: MDD (major depressive disorder), recurrent episode, severe  (HCC) Long Term Goal(s): Safe transition to appropriate next level of care at discharge, Engage patient in therapeutic group addressing interpersonal concerns.  Short Term Goals: Engage patient in aftercare planning with referrals and resources, Increase social support, Increase ability to appropriately verbalize feelings, Increase emotional regulation, Facilitate acceptance of mental health diagnosis and concerns, and Increase skills for wellness and recovery  Therapeutic Interventions: Assess for all discharge needs, 1 to 1 time with Social worker, Explore available resources and support systems, Assess for adequacy in community support network, Educate family and significant other(s) on suicide prevention, Complete Psychosocial Assessment, Interpersonal group therapy.  Evaluation of Outcomes: Progressing   Progress in Treatment: Attending groups: Yes. Participating in groups: Yes. Taking medication as prescribed: Yes. Toleration medication: Yes. Family/Significant other contact made: No, will contact:  Once permission is provided Patient understands diagnosis: Yes. Discussing patient identified problems/goals with staff: Yes. Medical problems stabilized or resolved: Yes. Denies suicidal/homicidal ideation: Yes. Issues/concerns per patient self-inventory: No. Other: None  New problem(s) identified: No, Describe:  none  Update 07/12/2024:  No changes at this time.  Update 07/18/2024: No changes at this time.   New Short Term/Long Term Goal(s): detox, elimination of symptoms of psychosis, medication management for mood stabilization;  elimination of SI thoughts; development of comprehensive mental wellness/sobriety plan. Update 07/12/2024:  No changes at this time.   Update 07/18/2024: No changes at this time.   Patient Goals:  medication and get a therapist and psychiatrist Update 07/12/2024:  No changes at this time.   Update 07/18/2024: No changes at this time.   Discharge Plan or  Barriers: CSW to assist in the development of appropriate discharge plans. Update 07/12/2024: CSW to reach out to [facility] DSS to discern if any placement assistance is available.    Update 07/18/2024: No changes at this time.   Reason for Continuation of Hospitalization: Anxiety Depression Medication stabilization Suicidal ideation   Estimated Length of Stay:  1-7 days Update 07/12/2024:  TBD   Update 07/18/2024: TBD  Last 3 [place] Suicide Severity Risk Score: Flowsheet Row Admission (Current) from 07/06/2024 in [facility] INPAT[facility]INE ED from 07/04/2024 in [facility] UC from 10/24/2023 in [facility])  C-SSRS RISK CATEGORY No Risk No Risk Error: Q2 is Yes, you must answer 3, 4, and 5    Last PHQ 2/9 Scores:    07/21/2020    2:04 PM  Depression screen PHQ 2/9  Decreased Interest 3  Down, Depressed, Hopeless 3  PHQ - 2 Score 6  Altered sleeping 0  Tired, decreased energy 3  Change in appetite 0  Feeling bad or failure about yourself  3  Trouble concentrating 0  Moving slowly or fidgety/restless 3  PHQ-9 Score 15      Data saved with a previous flowsheet row definition    Scribe for Treatment Team: Roselyn GORMAN Lento, LCSW 07/18/2024 12:40 PM

## 2024-07-19 NOTE — Plan of Care (Signed)
   Problem: Education: Goal: Knowledge of [facility] Education information/materials will improve Outcome: Progressing   Problem: Education: Goal: Emotional status will improve Outcome: Progressing   Problem: Education: Goal: Mental status will improve Outcome: Progressing   Problem: Education: Goal: Verbalization of understanding the information provided will improve Outcome: Progressing

## 2024-07-19 NOTE — Progress Notes (Signed)
" °   07/19/24 0900  Psych Admission Type (Psych Patients Only)  Admission Status Involuntary  Psychosocial Assessment  Patient Complaints None  Eye Contact Fair  Facial Expression Animated;Fixed smile  Affect Anxious  Speech Logical/coherent  Interaction Assertive  Motor Activity Slow  Appearance/Hygiene Layered clothes  Behavior Characteristics Appropriate to situation;Cooperative  Mood Pleasant  Aggressive Behavior  Effect No apparent injury  Thought Process  Coherency WDL  Content WDL  Delusions None reported or observed  Perception WDL  Hallucination None reported or observed  Judgment WDL  Confusion None  Danger to Self  Current suicidal ideation? Denies  Self-Injurious Behavior No self-injurious ideation or behavior indicators observed or expressed   Agreement Not to Harm Self Yes  Description of Agreement verbal  Danger to Others  Danger to Others None reported or observed    "

## 2024-07-19 NOTE — Group Note (Signed)
 Recreation Therapy Group Note   Group Topic:Self-Esteem  Group Date: 07/19/2024 Start Time: 1100 End Time: 1140 Facilitators: Celestia Jeoffrey BRAVO, LRT, CTRS Location: Craft Room  Group Description: Positive Affirmation Worksheet. Patients and LRT discussed the importance of self-love/self-esteem and things that cause it to fluctuate, including our mental health. Patients completed a worksheet that helps them identify 24 different strengths and qualities about themselves. Pt encouraged to read aloud at least 3 off their sheet to the group. LRT and pts discussed how this can be applied to daily life post-discharge.   Goal Area(s) Addressed: Patient will identify positive qualities about themselves. Patient will learn new positive affirmations.  Patient will recite positive qualities and affirmations aloud to the group.  Patient will practice positive self-talk.  Patient will increase communication.   Affect/Mood: N/A   Participation Level: Did not attend    Clinical Observations/Individualized Feedback: Patient did not attend.  Plan: Continue to engage patient in RT group sessions 2-3x/week.   Jeoffrey BRAVO Celestia, LRT, CTRS 07/19/2024 12:02 PM

## 2024-07-19 NOTE — Progress Notes (Signed)
" °   07/18/24 2000  Psych Admission Type (Psych Patients Only)  Admission Status Involuntary  Psychosocial Assessment  Patient Complaints None  Eye Contact Fair  Facial Expression Flat  Affect Anxious  Speech Logical/coherent  Interaction Assertive  Motor Activity Slow  Appearance/Hygiene Layered clothes  Behavior Characteristics Cooperative;Appropriate to situation;Calm  Mood Pleasant  Thought Process  Coherency WDL  Content WDL  Delusions None reported or observed  Perception WDL  Hallucination None reported or observed  Judgment WDL  Confusion None  Danger to Self  Current suicidal ideation? Denies  Agreement Not to Harm Self Yes  Description of Agreement verbal  Danger to Others  Danger to Others None reported or observed   Patient is alert and oriented x 4, affect is bright, she denies SI/HI/AVH no distress noted, she isolates to the room but interacts with staff. 15 minutes safety checks maintained.  "

## 2024-07-19 NOTE — Group Note (Signed)
 Date:  07/19/2024 Time:  8:50 PM  Group Topic/Focus:  Orientation:   The focus of this group is to educate the patient on the purpose and policies of crisis stabilization and provide a format to answer questions about their admission.  The group details unit policies and expectations of patients while admitted. Wrap-Up Group:   The focus of this group is to help patients review their daily goal of treatment and discuss progress on daily workbooks.    Participation Level:  Active  Participation Quality:  Appropriate and Attentive  Affect:  Appropriate  Cognitive:  Alert and Appropriate  Insight: Appropriate and Good  Engagement in Group:  Engaged  Modes of Intervention:  Orientation  Additional Comments:     Arlester CHRISTELLA Servant 07/19/2024, 8:50 PM

## 2024-07-20 NOTE — Progress Notes (Signed)
" °   07/20/24 0500  Psych Admission Type (Psych Patients Only)  Admission Status Involuntary  Psychosocial Assessment  Patient Complaints None  Eye Contact Fair  Facial Expression Animated  Affect Anxious  Speech Logical/coherent  Interaction Assertive  Motor Activity Slow  Appearance/Hygiene Layered clothes  Behavior Characteristics Appropriate to situation  Mood Pleasant  Aggressive Behavior  Effect No apparent injury  Thought Process  Coherency WDL  Content WDL  Delusions None reported or observed  Perception WDL  Hallucination None reported or observed  Judgment WDL  Confusion None  Danger to Self  Current suicidal ideation? Denies  Danger to Others  Danger to Others None reported or observed    "

## 2024-07-20 NOTE — Group Note (Signed)
 Recreation Therapy Group Note   Group Topic:Coping Skills  Group Date: 07/20/2024 Start Time: 1000 End Time: 1050 Facilitators: Celestia Jeoffrey BRAVO, LRT, CTRS Location: Craft Room  Group Description: Mind Map.  Patient was provided a blank template of a diagram with 32 blank boxes in a tiered system, branching from the center (similar to a bubble chart). LRT directed patients to label the middle of the diagram Coping Skills. LRT and patients then came up with 8 different coping skills as examples. Pt were directed to record their coping skills in the 2nd tier boxes closest to the center.  Patients would then share their coping skills with the group as LRT wrote them out. LRT gave a handout of 99 different coping skills at the end of group.   Goal Area(s) Addressed: Patients will be able to define coping skills. Patient will identify new coping skills.  Patient will increase communication.   Affect/Mood: N/A   Participation Level: Did not attend    Clinical Observations/Individualized Feedback: Patient did not attend.  Plan: Continue to engage patient in RT group sessions 2-3x/week.   Jeoffrey BRAVO Celestia, LRT, CTRS 07/20/2024 11:08 AM

## 2024-07-20 NOTE — BHH Counselor (Addendum)
 CSW checked in with [employer] and spoke with the director, Norleen Gavel. John confirmed they have a program that would be a good fit for the patients IDD needs.  The patient shared that she wants to work, build independence, and have a phone.  John explained that with the patients current [facility], a 1915(i) assessment will need to be completed by her [employer] for transitional support and supported employment. Once thats done, they should be able to schedule a virtual interview with the patient for possible placement.  CSW also contacted [employer] at [phone] and was asked to provide [facility] NPI ([phone]). CSW was informed that the patients MCO is Alfonso Reasons, and the [facility] supervisor is Courtnee Powell. However, receptionist did not have direct contact information for either.   CSW provided with email address: [email or web address] to contact the [facility] department. CSW sent HIPAA compliant email.   Receptionist sent message to her direct supervisor on best contact method for [facility] and will contact CSW back once notified.    CSW team to continue to assess.   Galia Rahm, MSW, LCSWA 07/20/2024 5:18 PM

## 2024-07-20 NOTE — Plan of Care (Signed)
   Problem: Education: Goal: Emotional status will improve Outcome: Progressing Goal: Mental status will improve Outcome: Progressing

## 2024-07-20 NOTE — Plan of Care (Signed)
?  Problem: Education: ?Goal: Knowledge of [facility] information/materials will improve ?Outcome: Progressing ?Goal: Emotional status will improve ?Outcome: Progressing ?Goal: Mental status will improve ?Outcome: Progressing ?Goal: Verbalization of understanding the information provided will improve ?Outcome: Progressing ?  ?Problem: Activity: ?Goal: Interest or engagement in activities will improve ?Outcome: Progressing ?Goal: Sleeping patterns will improve ?Outcome: Progressing ?  ?Problem: Coping: ?Goal: Ability to verbalize frustrations and anger appropriately will improve ?Outcome: Progressing ?Goal: Ability to demonstrate self-control will improve ?Outcome: Progressing ?  ?Problem: Health Behavior/Discharge Planning: ?Goal: Identification of resources available to assist in meeting health care needs will improve ?Outcome: Progressing ?Goal: Compliance with treatment plan for underlying cause of condition will improve ?Outcome: Progressing ?  ?Problem: Physical Regulation: ?Goal: Ability to maintain clinical measurements within normal limits will improve ?Outcome: Progressing ?  ?Problem: Safety: ?Goal: Periods of time without injury will increase ?Outcome: Progressing ?  ?Problem: Education: ?Goal: Ability to state activities that reduce stress will improve ?Outcome: Progressing ?  ?Problem: Coping: ?Goal: Ability to identify and develop effective coping behavior will improve ?Outcome: Progressing ?  ?Problem: Self-Concept: ?Goal: Ability to identify factors that promote anxiety will improve ?Outcome: Progressing ?Goal: Level of anxiety will decrease ?Outcome: Progressing ?Goal: Ability to modify response to factors that promote anxiety will improve ?Outcome: Progressing ?  ?Problem: Education: ?Goal: Utilization of techniques to improve thought processes will improve ?Outcome: Progressing ?Goal: Knowledge of the prescribed therapeutic regimen will improve ?Outcome: Progressing ?  ?Problem:  Activity: ?Goal: Interest or engagement in leisure activities will improve ?Outcome: Progressing ?Goal: Imbalance in normal sleep/wake cycle will improve ?Outcome: Progressing ?  ?Problem: Coping: ?Goal: Coping ability will improve ?Outcome: Progressing ?Goal: Will verbalize feelings ?Outcome: Progressing ?  ?Problem: Health Behavior/Discharge Planning: ?Goal: Ability to make decisions will improve ?Outcome: Progressing ?Goal: Compliance with therapeutic regimen will improve ?Outcome: Progressing ?  ?Problem: Role Relationship: ?Goal: Will demonstrate positive changes in social behaviors and relationships ?Outcome: Progressing ?  ?Problem: Safety: ?Goal: Ability to disclose and discuss suicidal ideas will improve ?Outcome: Progressing ?Goal: Ability to identify and utilize support systems that promote safety will improve ?Outcome: Progressing ?  ?Problem: Self-Concept: ?Goal: Will verbalize positive feelings about self ?Outcome: Progressing ?Goal: Level of anxiety will decrease ?Outcome: Progressing ?  ?

## 2024-07-21 ENCOUNTER — Encounter: Payer: Self-pay | Admitting: Obstetrics and Gynecology

## 2024-07-21 NOTE — Group Note (Signed)
 Date:  07/21/2024 Time:  11:12 AM  Group Topic/Focus:  Goals Group:   The focus of this group is to help patients establish daily goals to achieve during treatment and discuss how the patient can incorporate goal setting into their daily lives to aide in recovery.    Participation Level:  Did Not Attend    Amanda [name] 07/21/2024, 11:12 AM

## 2024-07-21 NOTE — Group Note (Signed)
 Date:  07/21/2024 Time:  9:03 PM  Group Topic/Focus:  Wrap-Up Group:   The focus of this group is to help patients review their daily goal of treatment and discuss progress on daily workbooks.    Participation Level:  Active  Participation Quality:  Attentive  Affect:  Appropriate  Cognitive:  Appropriate  Insight: Appropriate  Engagement in Group:  Engaged  Modes of Intervention:  Activity    Cecilio GORMAN Getting 07/21/2024, 9:03 PM

## 2024-07-21 NOTE — Plan of Care (Signed)
   Problem: Education: Goal: Mental status will improve Outcome: Progressing Goal: Verbalization of understanding the information provided will improve Outcome: Progressing   Problem: Activity: Goal: Interest or engagement in activities will improve Outcome: Progressing

## 2024-07-21 NOTE — BHH Counselor (Signed)
 CSW touched base with [employer] care resources at [phone] to follow up on cqre coordination contact.   CSW spoke with represetative, Darice who reported that at this time, the patient has opt'd out to care cooridnation services. Darice explained that patient could opt in but that the change would not go into effect until 08/15/24.   Darice reported that since the patient is inpatient, she can do a care coordination referral and escelate it in hjopes of getting the 1915 I completed.

## 2024-07-21 NOTE — Group Note (Signed)
 [facility] LCSW Group Therapy Note   Group Date: 07/21/2024 Start Time: 1300 End Time: 1400   Type of Therapy/Topic:  Group Therapy:  Emotion Regulation  Participation Level:  Active   Mood:  Description of Group:    The purpose of this group is to assist patients in learning to regulate negative emotions and experience positive emotions. Patients will be guided to discuss ways in which they have been vulnerable to their negative emotions. These vulnerabilities will be juxtaposed with experiences of positive emotions or situations, and patients challenged to use positive emotions to combat negative ones. Special emphasis will be placed on coping with negative emotions in conflict situations, and patients will process healthy conflict resolution skills.  Therapeutic Goals: Patient will identify two positive emotions or experiences to reflect on in order to balance out negative emotions:  Patient will label two or more emotions that they find the most difficult to experience:  Patient will be able to demonstrate positive conflict resolution skills through discussion or role plays:   Summary of Patient Progress: Patient was present in group.  Patient was an engaged participant in group.  Patient was supportive of others. Patient was appropriate in group, however concerns for some delay in processing.    Therapeutic Modalities:   Cognitive Behavioral Therapy Feelings Identification Dialectical Behavioral Therapy   [name], LCSW

## 2024-07-21 NOTE — Progress Notes (Signed)
 Patient is compliant with treatment and programing. Scheduled medications administered per Provider order. Support and encouragement provided. Routine safety checks conducted every 15 minutes. Patient notified to inform staff with problems or concerns.  No adverse drug reactions noted. Patient contracts for safety at this time. Will continue to monitor Patient.
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
## 2024-07-21 NOTE — Plan of Care (Signed)
?  Problem: Education: ?Goal: Knowledge of [facility] information/materials will improve ?Outcome: Progressing ?Goal: Emotional status will improve ?Outcome: Progressing ?Goal: Mental status will improve ?Outcome: Progressing ?Goal: Verbalization of understanding the information provided will improve ?Outcome: Progressing ?  ?Problem: Activity: ?Goal: Interest or engagement in activities will improve ?Outcome: Progressing ?Goal: Sleeping patterns will improve ?Outcome: Progressing ?  ?Problem: Coping: ?Goal: Ability to verbalize frustrations and anger appropriately will improve ?Outcome: Progressing ?Goal: Ability to demonstrate self-control will improve ?Outcome: Progressing ?  ?Problem: Health Behavior/Discharge Planning: ?Goal: Identification of resources available to assist in meeting health care needs will improve ?Outcome: Progressing ?Goal: Compliance with treatment plan for underlying cause of condition will improve ?Outcome: Progressing ?  ?Problem: Physical Regulation: ?Goal: Ability to maintain clinical measurements within normal limits will improve ?Outcome: Progressing ?  ?Problem: Safety: ?Goal: Periods of time without injury will increase ?Outcome: Progressing ?  ?Problem: Education: ?Goal: Ability to state activities that reduce stress will improve ?Outcome: Progressing ?  ?Problem: Coping: ?Goal: Ability to identify and develop effective coping behavior will improve ?Outcome: Progressing ?  ?Problem: Self-Concept: ?Goal: Ability to identify factors that promote anxiety will improve ?Outcome: Progressing ?Goal: Level of anxiety will decrease ?Outcome: Progressing ?Goal: Ability to modify response to factors that promote anxiety will improve ?Outcome: Progressing ?  ?Problem: Education: ?Goal: Utilization of techniques to improve thought processes will improve ?Outcome: Progressing ?Goal: Knowledge of the prescribed therapeutic regimen will improve ?Outcome: Progressing ?  ?Problem:  Activity: ?Goal: Interest or engagement in leisure activities will improve ?Outcome: Progressing ?Goal: Imbalance in normal sleep/wake cycle will improve ?Outcome: Progressing ?  ?Problem: Coping: ?Goal: Coping ability will improve ?Outcome: Progressing ?Goal: Will verbalize feelings ?Outcome: Progressing ?  ?Problem: Health Behavior/Discharge Planning: ?Goal: Ability to make decisions will improve ?Outcome: Progressing ?Goal: Compliance with therapeutic regimen will improve ?Outcome: Progressing ?  ?Problem: Role Relationship: ?Goal: Will demonstrate positive changes in social behaviors and relationships ?Outcome: Progressing ?  ?Problem: Safety: ?Goal: Ability to disclose and discuss suicidal ideas will improve ?Outcome: Progressing ?Goal: Ability to identify and utilize support systems that promote safety will improve ?Outcome: Progressing ?  ?Problem: Self-Concept: ?Goal: Will verbalize positive feelings about self ?Outcome: Progressing ?Goal: Level of anxiety will decrease ?Outcome: Progressing ?  ?

## 2024-07-21 NOTE — Progress Notes (Signed)
" °   07/21/24 0552  Psych Admission Type (Psych Patients Only)  Admission Status Involuntary  Psychosocial Assessment  Patient Complaints Anxiety  Eye Contact Fair  Facial Expression Animated  Affect Anxious  Speech Logical/coherent  Interaction Assertive  Motor Activity Slow  Appearance/Hygiene Layered clothes  Behavior Characteristics Appropriate to situation  Mood Pleasant  Aggressive Behavior  Effect No apparent injury  Thought Process  Coherency WDL  Content WDL  Delusions None reported or observed  Perception WDL  Hallucination None reported or observed  Judgment WDL  Confusion None  Danger to Self  Current suicidal ideation? Denies  Danger to Others  Danger to Others None reported or observed    "
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
## 2024-07-22 NOTE — BHH Counselor (Signed)
 CSW followed up on calls regarding possible placement options.   CSW was informed that the [facility] is not an option for the placement at this time. She reports that the housing options are based of the [employer], [employer] for [facility]. Patient would have to be placed on both lists before she could be placed.   Patient would also need to be able to leave independently (taking medications, cooking, cleaning, financial responsibility) and their are possible concerns on patient's ability to complete these tasks.   Sherryle Margo, MSW, LCSW 07/22/2024 4:36 PM

## 2024-07-22 NOTE — Plan of Care (Signed)
   Problem: Coping: Goal: Ability to verbalize frustrations and anger appropriately will improve Outcome: Progressing

## 2024-07-22 NOTE — Progress Notes (Signed)
" °   07/22/24 0634  Psych Admission Type (Psych Patients Only)  Admission Status Involuntary  Psychosocial Assessment  Patient Complaints None  Eye Contact Fair  Facial Expression Animated  Affect Anxious  Speech Logical/coherent  Interaction Assertive  Motor Activity Slow  Appearance/Hygiene Layered clothes  Behavior Characteristics Appropriate to situation  Mood Pleasant  Aggressive Behavior  Effect No apparent injury  Thought Process  Coherency WDL  Content WDL  Delusions None reported or observed  Perception WDL  Hallucination None reported or observed  Judgment WDL  Confusion None  Danger to Self  Current suicidal ideation? Denies  Danger to Others  Danger to Others None reported or observed    "

## 2024-07-22 NOTE — Group Note (Signed)
 Recreation Therapy Group Note   Group Topic:Coping Skills  Group Date: 07/22/2024 Start Time: 1530 End Time: 1615 Facilitators: Celestia Jeoffrey BRAVO, LRT, CTRS Location: [facility]  Group Description: Painting a Diplomatic Services Operational Officer. Participants will engage in a guided art activity focused on painting a personal peaceful place. This group encourages relaxation, self-expression, and mindfulness through creative exploration. Participants are invited to imagine a location where they feel calm, safe, or content and represent it using paint and other art materials. Emphasis is placed on the process rather than artistic skill, allowing individuals to express emotions, reduce stress, and increase self-awareness in a supportive group setting.  Goal Area(s) Addressed:   Patient will promote relaxation and stress reduction  Patient will enhance emotional expression and self-awareness  Patient will be given support in coping skills and grounding techniques  Patient will be encouraged to use creativity and positive leisure engagement  Patient will foster social interaction and group participation   Affect/Mood: Appropriate   Participation Level: Active and Engaged   Participation Quality: Independent   Behavior: Alert and Appropriate   Speech/Thought Process: Coherent   Insight: Good   Judgement: Good   Modes of Intervention: Art   Patient Response to Interventions:  Attentive, Engaged, Interested , and Receptive   Education Outcome:  Acknowledges education   Clinical Observations/Individualized Feedback: Letty was active in their participation of session activities and group discussion. Pt identified [place] as her peaceful place.    Plan: Continue to engage patient in RT group sessions 2-3x/week.   Jeoffrey BRAVO Celestia, LRT, CTRS 07/22/2024 5:04 PM

## 2024-07-22 NOTE — Plan of Care (Signed)
?  Problem: Education: ?Goal: Knowledge of [facility] information/materials will improve ?Outcome: Progressing ?Goal: Emotional status will improve ?Outcome: Progressing ?Goal: Mental status will improve ?Outcome: Progressing ?Goal: Verbalization of understanding the information provided will improve ?Outcome: Progressing ?  ?Problem: Activity: ?Goal: Interest or engagement in activities will improve ?Outcome: Progressing ?Goal: Sleeping patterns will improve ?Outcome: Progressing ?  ?Problem: Coping: ?Goal: Ability to verbalize frustrations and anger appropriately will improve ?Outcome: Progressing ?Goal: Ability to demonstrate self-control will improve ?Outcome: Progressing ?  ?Problem: Health Behavior/Discharge Planning: ?Goal: Identification of resources available to assist in meeting health care needs will improve ?Outcome: Progressing ?Goal: Compliance with treatment plan for underlying cause of condition will improve ?Outcome: Progressing ?  ?Problem: Physical Regulation: ?Goal: Ability to maintain clinical measurements within normal limits will improve ?Outcome: Progressing ?  ?Problem: Safety: ?Goal: Periods of time without injury will increase ?Outcome: Progressing ?  ?Problem: Education: ?Goal: Ability to state activities that reduce stress will improve ?Outcome: Progressing ?  ?Problem: Coping: ?Goal: Ability to identify and develop effective coping behavior will improve ?Outcome: Progressing ?  ?Problem: Self-Concept: ?Goal: Ability to identify factors that promote anxiety will improve ?Outcome: Progressing ?Goal: Level of anxiety will decrease ?Outcome: Progressing ?Goal: Ability to modify response to factors that promote anxiety will improve ?Outcome: Progressing ?  ?Problem: Education: ?Goal: Utilization of techniques to improve thought processes will improve ?Outcome: Progressing ?Goal: Knowledge of the prescribed therapeutic regimen will improve ?Outcome: Progressing ?  ?Problem:  Activity: ?Goal: Interest or engagement in leisure activities will improve ?Outcome: Progressing ?Goal: Imbalance in normal sleep/wake cycle will improve ?Outcome: Progressing ?  ?Problem: Coping: ?Goal: Coping ability will improve ?Outcome: Progressing ?Goal: Will verbalize feelings ?Outcome: Progressing ?  ?Problem: Health Behavior/Discharge Planning: ?Goal: Ability to make decisions will improve ?Outcome: Progressing ?Goal: Compliance with therapeutic regimen will improve ?Outcome: Progressing ?  ?Problem: Role Relationship: ?Goal: Will demonstrate positive changes in social behaviors and relationships ?Outcome: Progressing ?  ?Problem: Safety: ?Goal: Ability to disclose and discuss suicidal ideas will improve ?Outcome: Progressing ?Goal: Ability to identify and utilize support systems that promote safety will improve ?Outcome: Progressing ?  ?Problem: Self-Concept: ?Goal: Will verbalize positive feelings about self ?Outcome: Progressing ?Goal: Level of anxiety will decrease ?Outcome: Progressing ?  ?

## 2024-07-22 NOTE — BHH Counselor (Signed)
 CSW touched base with [facility] care resources at [phone] to follow up on care coordination referral for 1915 I assessment.   Representative reported that there has not been a care coordinator assigned to the case yet. Representative sent a follow up message to team on referral to further escalate.   CSW to continue to assess.   Cebastian Neis, MSW, LCSWA 07/22/2024 11:00 AM

## 2024-07-22 NOTE — BHH Counselor (Signed)
 CSW was contacted by Chevaun Dixon, [facility] Discharge Planner via email from [email or web address]; [phone].   Chevaun reported that she has been assigned to the case following referral made by CSW to help facilitate discharge and the needed 1915 I assessment.   Chevaun emailed member engagement at [email or web address] to help start the process.   CSW touched base with Norleen Gavel, director of [employer] [facility] who has agreed to meet with CSW and Chavaun Dixon tomorrow 07/23/24 at 9:30 AM to get [employer] [facility] the needed documentation to move forward with placement. Meeting information has been sent to all parties to coordinate safe discharge planning.   Etan Vasudevan, MSW, LCSWA 07/22/2024 3:10 PM

## 2024-07-22 NOTE — Progress Notes (Signed)
" °   07/22/24 1700  Psych Admission Type (Psych Patients Only)  Admission Status Involuntary  Psychosocial Assessment  Patient Complaints Nervousness (patient reported that she was uncomfortable around a female patient on the unit)  Eye Contact Fair;Watchful  Facial Expression Fixed smile;Animated  Affect Appropriate to circumstance  Speech Logical/coherent  Interaction Assertive  Motor Activity Slow  Appearance/Hygiene Unremarkable  Behavior Characteristics Cooperative;Appropriate to situation  Mood Pleasant  Aggressive Behavior  Effect No apparent injury  Thought Process  Coherency WDL  Content WDL  Delusions None reported or observed  Perception WDL  Hallucination None reported or observed  Judgment WDL  Confusion None  Danger to Self  Current suicidal ideation? Denies  Self-Injurious Behavior No self-injurious ideation or behavior indicators observed or expressed   Agreement Not to Harm Self Yes  Description of Agreement Verbal  Danger to Others  Danger to Others None reported or observed   Patient's goal for today, per her self-inventory is discharge, in which she will be patient for updates in order to achieve his goal.  "

## 2024-07-22 NOTE — Group Note (Signed)
 Recreation Therapy Group Note   Group Topic:Animal Assisted Therapy   Group Date: 07/22/2024 Start Time: 1045 End Time: 1100 Facilitators: Celestia Jeoffrey BRAVO, LRT, CTRS Location: Dayroom  Group Description: AAA. Animal-Assisted Activity provides opportunities for motivational, educational, therapeutic and/or recreational benefits to enhance quality of life. Selinda and Rollo visited the unit to interact with patients.   Goal Areas Addressed:  Reduced anxiety and stress Improved mood Increased social interaction Enhanced communication skills Reduced loneliness and isolation Improved emotional regulation   Affect/Mood: N/A   Participation Level: Did not attend    Clinical Observations/Individualized Feedback: Patient did not attend.  Plan: Continue to engage patient in RT group sessions 2-3x/week.   Jeoffrey BRAVO Celestia, LRT, CTRS 07/22/2024 11:36 AM

## 2024-07-22 NOTE — Group Note (Signed)
 Date:  07/22/2024 Time:  8:55 PM  Group Topic/Focus:  Personal Choices and Values:   The focus of this group is to help patients assess and explore the importance of values in their lives, how their values affect their decisions, how they express their values and what opposes their expression.    Participation Level:  Active  Participation Quality:  Appropriate  Affect:  Appropriate  Cognitive:  Appropriate  Insight: Appropriate  Engagement in Group:  Engaged  Modes of Intervention:  Discussion  Additional Comments:    Amanda [name] L 07/22/2024, 8:55 PM

## 2024-07-22 NOTE — Group Note (Signed)
 Date:  07/22/2024 Time:  1:31 PM  Group Topic/Focus:  Goals Group:   The focus of this group is to help patients establish daily goals to achieve during treatment and discuss how the patient can incorporate goal setting into their daily lives to aide in recovery.    Participation Level:  Did Not Attend   Amanda [name] 07/22/2024, 1:31 PM

## 2024-07-22 NOTE — Group Note (Signed)
 LCSW Group Therapy Note   Group Date: 07/22/2024 Start Time: 1300 End Time: 1400   Type of Therapy and Topic:  Group Therapy: Challenging Core Beliefs  Participation Level:  Did Not Attend  Description of Group:  Patients were educated about core beliefs and asked to identify one harmful core belief that they have. Patients were asked to explore from where those beliefs originate. Patients were asked to discuss how those beliefs make them feel and the resulting behaviors of those beliefs. They were then be asked if those beliefs are true and, if so, what evidence they have to support them. Lastly, group members were challenged to replace those negative core beliefs with helpful beliefs.   Therapeutic Goals:   1. Patient will identify harmful core beliefs and explore the origins of such beliefs. 2. Patient will identify feelings and behaviors that result from those core beliefs. 3. Patient will discuss whether such beliefs are true. 4.  Patient will replace harmful core beliefs with helpful ones.  Summary of Patient Progress:  Patient did not attend.   Therapeutic Modalities: Cognitive Behavioral Therapy; Solution-Focused Therapy   Jaceion Aday M Powell Halbert, LCSWA 07/22/2024  2:05 PM

## 2024-07-22 NOTE — BHH Counselor (Signed)
 CSW touched base with Norleen Gavel with [employer] [facility] to make aware of pending case coordination referral made with [facility] on patient's behalf to start 1915 I assessment.   CSW to continue to assess.   Siena Poehler, MSW, LCSWA 07/22/2024 10:20 AM

## 2024-07-23 NOTE — Plan of Care (Signed)
?  Problem: Education: ?Goal: Knowledge of [facility] information/materials will improve ?Outcome: Progressing ?Goal: Emotional status will improve ?Outcome: Progressing ?Goal: Mental status will improve ?Outcome: Progressing ?Goal: Verbalization of understanding the information provided will improve ?Outcome: Progressing ?  ?Problem: Activity: ?Goal: Interest or engagement in activities will improve ?Outcome: Progressing ?Goal: Sleeping patterns will improve ?Outcome: Progressing ?  ?Problem: Coping: ?Goal: Ability to verbalize frustrations and anger appropriately will improve ?Outcome: Progressing ?Goal: Ability to demonstrate self-control will improve ?Outcome: Progressing ?  ?Problem: Health Behavior/Discharge Planning: ?Goal: Identification of resources available to assist in meeting health care needs will improve ?Outcome: Progressing ?Goal: Compliance with treatment plan for underlying cause of condition will improve ?Outcome: Progressing ?  ?Problem: Physical Regulation: ?Goal: Ability to maintain clinical measurements within normal limits will improve ?Outcome: Progressing ?  ?Problem: Safety: ?Goal: Periods of time without injury will increase ?Outcome: Progressing ?  ?Problem: Education: ?Goal: Ability to state activities that reduce stress will improve ?Outcome: Progressing ?  ?Problem: Coping: ?Goal: Ability to identify and develop effective coping behavior will improve ?Outcome: Progressing ?  ?Problem: Self-Concept: ?Goal: Ability to identify factors that promote anxiety will improve ?Outcome: Progressing ?Goal: Level of anxiety will decrease ?Outcome: Progressing ?Goal: Ability to modify response to factors that promote anxiety will improve ?Outcome: Progressing ?  ?Problem: Education: ?Goal: Utilization of techniques to improve thought processes will improve ?Outcome: Progressing ?Goal: Knowledge of the prescribed therapeutic regimen will improve ?Outcome: Progressing ?  ?Problem:  Activity: ?Goal: Interest or engagement in leisure activities will improve ?Outcome: Progressing ?Goal: Imbalance in normal sleep/wake cycle will improve ?Outcome: Progressing ?  ?Problem: Coping: ?Goal: Coping ability will improve ?Outcome: Progressing ?Goal: Will verbalize feelings ?Outcome: Progressing ?  ?Problem: Health Behavior/Discharge Planning: ?Goal: Ability to make decisions will improve ?Outcome: Progressing ?Goal: Compliance with therapeutic regimen will improve ?Outcome: Progressing ?  ?Problem: Role Relationship: ?Goal: Will demonstrate positive changes in social behaviors and relationships ?Outcome: Progressing ?  ?Problem: Safety: ?Goal: Ability to disclose and discuss suicidal ideas will improve ?Outcome: Progressing ?Goal: Ability to identify and utilize support systems that promote safety will improve ?Outcome: Progressing ?  ?Problem: Self-Concept: ?Goal: Will verbalize positive feelings about self ?Outcome: Progressing ?Goal: Level of anxiety will decrease ?Outcome: Progressing ?  ?

## 2024-07-23 NOTE — Progress Notes (Signed)
" °   07/23/24 1400  Psych Admission Type (Psych Patients Only)  Admission Status Involuntary  Psychosocial Assessment  Patient Complaints None  Eye Contact Brief  Facial Expression Animated  Affect Appropriate to circumstance  Speech Logical/coherent  Interaction Assertive  Motor Activity Slow  Appearance/Hygiene Unremarkable  Behavior Characteristics Appropriate to situation;Cooperative  Mood Pleasant  Thought Process  Coherency WDL  Content WDL  Delusions None reported or observed  Perception WDL  Hallucination None reported or observed  Judgment WDL  Confusion None  Danger to Self  Current suicidal ideation? Denies  Self-Injurious Behavior No self-injurious ideation or behavior indicators observed or expressed   Agreement Not to Harm Self Yes  Description of Agreement verbal  Danger to Others  Danger to Others None reported or observed    "
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
## 2024-07-23 NOTE — Group Note (Signed)
 Recreation Therapy Group Note   Group Topic:Leisure Education  Group Date: 07/23/2024 Start Time: 1530 End Time: 1620 Facilitators: Celestia Jeoffrey FORBES ARTICE, CTRS Location: Craft Room  Group Description: Leisure. Patients were given the option to choose from journaling, coloring, drawing, making origami, playing with playdoh, listening to music or singing karaoke. LRT and pts discussed the meaning of leisure, the importance of participating in leisure during their free time/when they're outside of the hospital, as well as how our leisure interests can also serve as coping skills.   Goal Area(s) Addressed:  Patient will identify a current leisure interest.  Patient will learn the definition of leisure. Patient will practice making a positive decision. Patient will have the opportunity to try a new leisure activity. Patient will communicate with peers and LRT.    Affect/Mood: Appropriate   Participation Level: Active and Engaged   Participation Quality: Independent   Behavior: Calm and Cooperative   Speech/Thought Process: Coherent   Insight: Good   Judgement: Good   Modes of Intervention: Art, Activity, Education, Exploration, Music, and Rapport Building   Patient Response to Interventions:  Attentive, Engaged, Interested , and Receptive   Education Outcome:  Acknowledges education   Clinical Observations/Individualized Feedback: Amanda [name] was active in their participation of session activities and group discussion. Pt identified game and talk with friends as things she does in her free time.    Plan: Continue to engage patient in RT group sessions 2-3x/week.   Jeoffrey FORBES Celestia, LRT, CTRS 07/23/2024 5:04 PM

## 2024-07-23 NOTE — BHH Group Notes (Signed)
 Spirituality Group   Description: Participant directed exploration of values, beliefs and meaning   **Focus on Gratitude: Invite reflection on sources of gratitude (external/internal); goal to invite internal gratitude to foster 1) reconnection with life-giving activities 2) self-compassion.   Following a brief framework of chaplains role and ground rules of group behavior, participants are invited to share concerns or questions that engage spiritual life. Emphasis placed on common themes and shared experiences and ways to make meaning and clarify living into ones values.   Theory/Process/Goal: Utilize the theoretical framework of group therapy established by Celena Kite, Relational Cultural Theory and Rogerian approaches to facilitate relational empathy and use of the here and now to foster reflection, self-awareness, and sharing.   Observations: Did not attend  Amanda [name] L. Delores HERO.Div

## 2024-07-23 NOTE — Group Note (Addendum)
 [facility] LCSW Group Therapy Note   Group Date: 07/23/2024 Start Time: 14:15 End Time: 15:10   Type of Therapy/Topic:  Group Therapy:  Emotion Regulation  Participation Level:  Active   Mood:  Description of Group:    The purpose of this group is to assist patients in learning to regulate negative emotions and experience positive emotions. Patients will be guided to discuss ways in which they have been vulnerable to their negative emotions. These vulnerabilities will be juxtaposed with experiences of positive emotions or situations, and patients challenged to use positive emotions to combat negative ones. Special emphasis will be placed on coping with negative emotions in conflict situations, and patients will process healthy conflict resolution skills.  Therapeutic Goals: Patient will identify two positive emotions or experiences to reflect on in order to balance out negative emotions:  Patient will label two or more emotions that they find the most difficult to experience:  Patient will be able to demonstrate positive conflict resolution skills through discussion or role plays:   Summary of Patient Progress:   Patients participated in a live role-play activity that required them to navigate complex, awkward, and emotionally evocative situations. Participants observed how emotions can escalate quickly and explored various coping skills that can be used to prevent emotional dysregulation. They practiced maintaining emotional regulation while remaining present, engaged, and active in the moment.    Therapeutic Modalities:   Cognitive Behavioral Therapy Feelings Identification Dialectical Behavioral Therapy   Amanda CHRISTELLA Kerns, LCSW

## 2024-07-23 NOTE — BHH Counselor (Signed)
 Meeting held with Norleen and Olam Gavel from [employer] [facility] and Chevaun Dixon, the hospital discharge planner, to discuss placement coordination for the patient.  Chevaun shared that she is the assigned extender on the case and will stay involved for seven days after discharge to help ensure continuity of care and secure placement. [facility] and Chevaun discussed possible options to help move discharge along while the 1915(i) is still pending. This may include [facility] covering costs temporarily in the interim. Chevaun will continue working with her team to explore options.  Although the patient has a reported Autism diagnosis, Chevaun reported that she has not yet been able to verify the source of the diagnosis, which is required for IDD placement. They are working on verifying the source or reassessing the patient for IDD eligibility.  [facility], the CSW, and Chevaun discussed placement options that may be the best fit given the patient's history. [facility] identified PSR/Day Treatment, intensive therapy, psychiatric services, and peer support as services that could be most helpful.  [facility] and [facility] requested an FL2. CSW to complete.  An email chain has been started with all parties to keep communication open and accessible.  CSW team to continue to assess.   Amanda [name], MSW, LCSWA 07/23/2024 10:11 AM

## 2024-07-23 NOTE — Group Note (Signed)
 Recreation Therapy Group Note   Group Topic:Stress Management  Group Date: 07/23/2024 Start Time: 1100 End Time: 1130 Facilitators: Celestia Jeoffrey BRAVO, LRT, CTRS Location: Dayroom  Group Description: Meditation. LRT and patients discussed what they know about meditation and mindfulness. LRT played a Deep Breathing Meditation exercise script for patients to follow along to. LRT and patients discussed how meditation and deep breathing can be used as a coping skill post--discharge to help manage symptoms of stress.   Goal Area(s) Addressed: Patient will practice using relaxation technique. Patient will identify a new coping skill.  Patient will follow multistep directions to reduce anxiety and stress.   Affect/Mood: Appropriate   Participation Level: Active and Engaged   Participation Quality: Independent   Behavior: Calm and Cooperative   Speech/Thought Process: Coherent   Insight: Good   Judgement: Good   Modes of Intervention: Activity, Clarification, Education, and Exploration   Patient Response to Interventions:  Attentive, Engaged, Interested , and Receptive   Education Outcome:  Acknowledges education   Clinical Observations/Individualized Feedback: Amanda [name] was active in their participation of session activities and group discussion. Pt interacted well with LRT and peers duration of session.    Plan: Continue to engage patient in RT group sessions 2-3x/week.   Jeoffrey BRAVO Celestia, LRT, CTRS 07/23/2024 11:48 AM

## 2024-07-23 NOTE — BH IP Treatment Plan (Signed)
 Interdisciplinary Treatment and Diagnostic Plan Update  07/23/2024 Time of Session: 9:00 AM Amanda [name] MRN: [id number]  Principal Diagnosis: MDD (major depressive disorder), recurrent episode, severe (HCC)  Secondary Diagnoses: Principal Problem:   MDD (major depressive disorder), recurrent episode, severe (HCC)   Current Medications:  Current Facility-Administered Medications  Medication Dose Route Frequency Provider Last Rate Last Admin   acetaminophen  (TYLENOL ) tablet 650 mg  650 mg Oral Q6H PRN Tex Drilling, NP   650 mg at 07/14/24 0928   alum & mag hydroxide-simeth (MAALOX/MYLANTA) 200-200-20 MG/5ML suspension 30 mL  30 mL Oral Q4H PRN Tex Drilling, NP       haloperidol  (HALDOL ) tablet 5 mg  5 mg Oral TID PRN Tex Drilling, NP       And   diphenhydrAMINE  (BENADRYL ) capsule 50 mg  50 mg Oral TID PRN Tex Drilling, NP       haloperidol  lactate (HALDOL ) injection 5 mg  5 mg Intramuscular TID PRN Tex Drilling, NP       And   diphenhydrAMINE  (BENADRYL ) injection 50 mg  50 mg Intramuscular TID PRN Tex Drilling, NP       And   LORazepam  (ATIVAN ) injection 2 mg  2 mg Intramuscular TID PRN Tex Drilling, NP       haloperidol  lactate (HALDOL ) injection 10 mg  10 mg Intramuscular TID PRN Tex Drilling, NP       And   diphenhydrAMINE  (BENADRYL ) injection 50 mg  50 mg Intramuscular TID PRN Tex Drilling, NP       And   LORazepam  (ATIVAN ) injection 2 mg  2 mg Intramuscular TID PRN Tex Drilling, NP       escitalopram  (LEXAPRO ) tablet 10 mg  10 mg Oral Daily May, Tanya, NP   10 mg at 07/22/24 1202   hydrOXYzine  (ATARAX ) tablet 25 mg  25 mg Oral TID PRN Tex Drilling, NP   25 mg at 07/06/24 2125   lamoTRIgine  (LAMICTAL ) tablet 50 mg  50 mg Oral Daily May, Tanya, NP   50 mg at 07/22/24 1202   loratadine  (CLARITIN ) tablet 10 mg  10 mg Oral Daily May, Tanya, NP   10 mg at 07/22/24 1202   magnesium  hydroxide (MILK OF MAGNESIA) suspension 30 mL  30 mL Oral Daily PRN  Tex Drilling, NP       nicotine  (NICODERM CQ  - dosed in mg/24 hours) patch 21 mg  21 mg Transdermal Daily Jadapalle, Sree, MD   21 mg at 07/22/24 1202   traZODone  (DESYREL ) tablet 25 mg  25 mg Oral QHS PRN May, Tanya, NP   25 mg at 07/22/24 2116   PTA Medications: Medications Prior to Admission  Medication Sig Dispense Refill Last Dose/Taking   busPIRone (BUSPAR) 5 MG tablet Take 5 mg by mouth 3 (three) times daily.      lithium  carbonate 300 MG capsule Take 300 mg by mouth daily.      naproxen sodium (ALEVE) 220 MG tablet Take 440 mg by mouth 2 (two) times daily as needed (For pain).       Patient Stressors:    Patient Strengths:    Treatment Modalities: Medication Management, Group therapy, Case management,  1 to 1 session with clinician, Psychoeducation, Recreational therapy.   Physician Treatment Plan for Primary Diagnosis: MDD (major depressive disorder), recurrent episode, severe (HCC) Long Term Goal(s): Improvement in symptoms so as ready for discharge   Short Term Goals: Ability to identify changes in lifestyle to reduce recurrence of condition will  improve Ability to verbalize feelings will improve Ability to disclose and discuss suicidal ideas Ability to identify and develop effective coping behaviors will improve Ability to maintain clinical measurements within normal limits will improve Ability to identify triggers associated with substance abuse/mental health issues will improve  Medication Management: Evaluate patient's response, side effects, and tolerance of medication regimen.  Therapeutic Interventions: 1 to 1 sessions, Unit Group sessions and Medication administration.  Evaluation of Outcomes: Progressing  Physician Treatment Plan for Secondary Diagnosis: Principal Problem:   MDD (major depressive disorder), recurrent episode, severe (HCC)  Long Term Goal(s): Improvement in symptoms so as ready for discharge   Short Term Goals: Ability to identify changes  in lifestyle to reduce recurrence of condition will improve Ability to verbalize feelings will improve Ability to disclose and discuss suicidal ideas Ability to identify and develop effective coping behaviors will improve Ability to maintain clinical measurements within normal limits will improve Ability to identify triggers associated with substance abuse/mental health issues will improve     Medication Management: Evaluate patient's response, side effects, and tolerance of medication regimen.  Therapeutic Interventions: 1 to 1 sessions, Unit Group sessions and Medication administration.  Evaluation of Outcomes: Progressing   RN Treatment Plan for Primary Diagnosis: MDD (major depressive disorder), recurrent episode, severe (HCC) Long Term Goal(s): Knowledge of disease and therapeutic regimen to maintain health will improve  Short Term Goals: Ability to verbalize frustration and anger appropriately will improve, Ability to demonstrate self-control, Ability to participate in decision making will improve, Ability to verbalize feelings will improve, Ability to disclose and discuss suicidal ideas, and Ability to identify and develop effective coping behaviors will improve  Medication Management: RN will administer medications as ordered by provider, will assess and evaluate patient's response and provide education to patient for prescribed medication. RN will report any adverse and/or side effects to prescribing provider.  Therapeutic Interventions: 1 on 1 counseling sessions, Psychoeducation, Medication administration, Evaluate responses to treatment, Monitor vital signs and CBGs as ordered, Perform/monitor CIWA, COWS, AIMS and Fall Risk screenings as ordered, Perform wound care treatments as ordered.  Evaluation of Outcomes: Progressing   LCSW Treatment Plan for Primary Diagnosis: MDD (major depressive disorder), recurrent episode, severe (HCC) Long Term Goal(s): Safe transition to  appropriate next level of care at discharge, Engage patient in therapeutic group addressing interpersonal concerns.  Short Term Goals: Engage patient in aftercare planning with referrals and resources, Increase social support, Increase ability to appropriately verbalize feelings, Increase emotional regulation, Facilitate acceptance of mental health diagnosis and concerns, Facilitate patient progression through stages of change regarding substance use diagnoses and concerns, Identify triggers associated with mental health/substance abuse issues, and Increase skills for wellness and recovery  Therapeutic Interventions: Assess for all discharge needs, 1 to 1 time with Social worker, Explore available resources and support systems, Assess for adequacy in community support network, Educate family and significant other(s) on suicide prevention, Complete Psychosocial Assessment, Interpersonal group therapy.  Evaluation of Outcomes: Progressing   Progress in Treatment: Attending groups: Yes. 07/23/24 Update: Yes.  Participating in groups: Yes. 07/23/24 Update: Yes.  Taking medication as prescribed: Yes. 07/23/24 Update: Yes.  Toleration medication: Yes. 07/23/24 Update: Yes.  Family/Significant other contact made: No, will contact:  Once permission is provided 07/23/24 Update: No, will contact:  Once permission is provided Patient understands diagnosis: Yes. 07/23/24 Update: Yes.  Discussing patient identified problems/goals with staff: Yes. 07/23/24 Update: Yes.  Medical problems stabilized or resolved: Yes. 07/23/24 Update: Yes.  Denies suicidal/homicidal ideation:  Yes. 07/23/24 Update: Yes.  Issues/concerns per patient self-inventory: No. 07/23/24 Update: No.  Other: None 07/23/24 Update: None.   New problem(s) identified: No, Describe:  none  Update 07/12/2024:  No changes at this time.  Update 07/18/2024: No changes at this time. 07/23/24 Update: No changes at this time.    New Short Term/Long  Term Goal(s): detox, elimination of symptoms of psychosis, medication management for mood stabilization; elimination of SI thoughts; development of comprehensive mental wellness/sobriety plan. Update 07/12/2024:  No changes at this time.   Update 07/18/2024: No changes at this time. 07/23/24 Update: No changes at this time.    Patient Goals:  medication and get a therapist and psychiatrist Update 07/12/2024:  No changes at this time.   Update 07/18/2024: No changes at this time. 07/23/24 Update: No changes at this time.    Discharge Plan or Barriers: CSW to assist in the development of appropriate discharge plans. Update 07/12/2024: CSW to reach out to [facility] to discern if any placement assistance is available.    Update 07/18/2024: No changes at this time. 07/23/24 Update: The CSW team will continue coordinating with [employer], [employer], DSS, and other placement resources to secure placement and support safe discharge planning.  Reason for Continuation of Hospitalization: Anxiety Depression Medication stabilization Suicidal ideation  Estimated Length of Stay:  1-7 days Update 07/12/2024:  TBD   Update 07/18/2024: TBD 07/23/24 Update: TBD.     Last 3 [place] Suicide Severity Risk Score: Flowsheet Row Admission (Current) from 07/06/2024 in [facility] ED from 07/04/2024 in [facility] UC from 10/24/2023 in [facility]  C-SSRS RISK CATEGORY No Risk No Risk Error: Q2 is Yes, you must answer 3, 4, and 5    Last PHQ 2/9 Scores:    07/21/2020    2:04 PM  Depression screen PHQ 2/9  Decreased Interest 3  Down, Depressed, Hopeless 3  PHQ - 2 Score 6  Altered sleeping 0  Tired, decreased energy 3  Change in appetite 0  Feeling bad or failure about yourself  3  Trouble concentrating 0  Moving slowly or fidgety/restless 3  PHQ-9 Score 15      Data saved with a previous flowsheet row  definition    Scribe for Treatment Team: Kimbree Casanas M Bland Rudzinski, KEN 07/23/2024 10:33 AM

## 2024-07-23 NOTE — Group Note (Signed)
 Date:  07/23/2024 Time:  8:56 PM  Group Topic/Focus:  Identifying Needs:   The focus of this group is to help patients identify their personal needs that have been historically problematic and identify healthy behaviors to address their needs. Making Healthy Choices:   The focus of this group is to help patients identify negative/unhealthy choices they were using prior to admission and identify positive/healthier coping strategies to replace them upon discharge. Managing Feelings:   The focus of this group is to identify what feelings patients have difficulty handling and develop a plan to handle them in a healthier way upon discharge.    Participation Level:  Active  Participation Quality:  Appropriate  Affect:  Appropriate  Cognitive:  Appropriate  Insight: Appropriate  Engagement in Group:  Engaged  Modes of Intervention:  Discussion  Additional Comments:    Amanda [name] L 07/23/2024, 8:56 PM

## 2024-08-16 ENCOUNTER — Encounter: Payer: Self-pay | Admitting: Obstetrics and Gynecology
